# Patient Record
Sex: Male | Born: 1950 | Marital: Married | Smoking: Unknown if ever smoked
Health system: Southern US, Community
[De-identification: ages and names within clinical notes are randomized; demographics above are authoritative.]

## PROBLEM LIST (undated history)

## (undated) DIAGNOSIS — Z8616 Personal history of COVID-19: Secondary | ICD-10-CM

## (undated) DIAGNOSIS — R41841 Cognitive communication deficit: Secondary | ICD-10-CM

## (undated) DIAGNOSIS — Z972 Presence of dental prosthetic device (complete) (partial): Secondary | ICD-10-CM

## (undated) DIAGNOSIS — N400 Enlarged prostate without lower urinary tract symptoms: Secondary | ICD-10-CM

## (undated) DIAGNOSIS — M5134 Other intervertebral disc degeneration, thoracic region: Secondary | ICD-10-CM

## (undated) DIAGNOSIS — M069 Rheumatoid arthritis, unspecified: Secondary | ICD-10-CM

## (undated) DIAGNOSIS — K219 Gastro-esophageal reflux disease without esophagitis: Secondary | ICD-10-CM

## (undated) DIAGNOSIS — F32A Depression, unspecified: Secondary | ICD-10-CM

## (undated) DIAGNOSIS — G8929 Other chronic pain: Secondary | ICD-10-CM

## (undated) DIAGNOSIS — I10 Essential (primary) hypertension: Secondary | ICD-10-CM

---
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
## 2018-07-04 DIAGNOSIS — F32A Depression, unspecified: Secondary | ICD-10-CM | POA: Insufficient documentation

## 2018-07-04 DIAGNOSIS — G894 Chronic pain syndrome: Secondary | ICD-10-CM | POA: Insufficient documentation

## 2018-07-04 DIAGNOSIS — Z5900 Homelessness unspecified: Secondary | ICD-10-CM | POA: Insufficient documentation

## 2018-07-11 DIAGNOSIS — M0579 Rheumatoid arthritis with rheumatoid factor of multiple sites without organ or systems involvement: Secondary | ICD-10-CM | POA: Insufficient documentation

## 2018-07-11 DIAGNOSIS — K219 Gastro-esophageal reflux disease without esophagitis: Secondary | ICD-10-CM | POA: Insufficient documentation

## 2018-07-11 DIAGNOSIS — N400 Enlarged prostate without lower urinary tract symptoms: Secondary | ICD-10-CM | POA: Insufficient documentation

## 2018-07-11 DIAGNOSIS — I10 Essential (primary) hypertension: Secondary | ICD-10-CM | POA: Insufficient documentation

## 2018-07-12 DIAGNOSIS — R7301 Impaired fasting glucose: Secondary | ICD-10-CM | POA: Insufficient documentation

## 2018-07-30 DIAGNOSIS — Z91199 Patient's noncompliance with other medical treatment and regimen due to unspecified reason: Secondary | ICD-10-CM | POA: Insufficient documentation

## 2018-07-30 DIAGNOSIS — Z9119 Patient's noncompliance with other medical treatment and regimen: Secondary | ICD-10-CM | POA: Insufficient documentation

## 2019-01-18 DIAGNOSIS — R972 Elevated prostate specific antigen [PSA]: Secondary | ICD-10-CM | POA: Insufficient documentation

## 2019-01-18 DIAGNOSIS — Z227 Latent tuberculosis: Secondary | ICD-10-CM | POA: Insufficient documentation

## 2019-02-17 DIAGNOSIS — N1330 Unspecified hydronephrosis: Secondary | ICD-10-CM | POA: Insufficient documentation

## 2019-02-18 DIAGNOSIS — R32 Unspecified urinary incontinence: Secondary | ICD-10-CM | POA: Insufficient documentation

## 2019-06-06 DIAGNOSIS — C61 Malignant neoplasm of prostate: Secondary | ICD-10-CM | POA: Insufficient documentation

## 2019-06-09 DIAGNOSIS — G8929 Other chronic pain: Secondary | ICD-10-CM | POA: Insufficient documentation

## 2019-06-09 DIAGNOSIS — N3090 Cystitis, unspecified without hematuria: Secondary | ICD-10-CM | POA: Insufficient documentation

## 2019-06-23 DIAGNOSIS — Z466 Encounter for fitting and adjustment of urinary device: Secondary | ICD-10-CM | POA: Insufficient documentation

## 2020-01-15 DIAGNOSIS — E785 Hyperlipidemia, unspecified: Secondary | ICD-10-CM | POA: Insufficient documentation

## 2020-01-15 DIAGNOSIS — Z96659 Presence of unspecified artificial knee joint: Secondary | ICD-10-CM | POA: Insufficient documentation

## 2020-01-15 DIAGNOSIS — F339 Major depressive disorder, recurrent, unspecified: Secondary | ICD-10-CM | POA: Insufficient documentation

## 2020-01-16 DIAGNOSIS — G4700 Insomnia, unspecified: Secondary | ICD-10-CM | POA: Insufficient documentation

## 2020-06-22 ENCOUNTER — Other Ambulatory Visit (HOSPITAL_COMMUNITY): Payer: Self-pay | Admitting: Urology

## 2020-06-22 ENCOUNTER — Other Ambulatory Visit: Payer: Self-pay | Admitting: Urology

## 2020-06-22 DIAGNOSIS — C61 Malignant neoplasm of prostate: Secondary | ICD-10-CM

## 2020-06-29 ENCOUNTER — Other Ambulatory Visit (HOSPITAL_COMMUNITY): Payer: Self-pay | Admitting: Urology

## 2020-06-29 ENCOUNTER — Other Ambulatory Visit: Payer: Self-pay | Admitting: Urology

## 2020-06-29 DIAGNOSIS — C61 Malignant neoplasm of prostate: Secondary | ICD-10-CM

## 2020-07-12 ENCOUNTER — Other Ambulatory Visit: Payer: Self-pay

## 2020-07-12 ENCOUNTER — Encounter (HOSPITAL_BASED_OUTPATIENT_CLINIC_OR_DEPARTMENT_OTHER): Payer: Self-pay | Admitting: Urology

## 2020-07-12 NOTE — Progress Notes (Signed)
Faxed preop instructions to [facility] [phone], pt is resident of [facility] and signs own consents and transfers independent from wheel chair, will be rapid same day covid test arrive 1015 am 07-20-2020. Labs needed I stat 8 and ekg.

## 2020-07-12 NOTE — Progress Notes (Signed)
Preop instructions for:   Rylin Seavey    Date of Birth     [birth year]                       Date of Procedure:  07-20-2020     Doctor: Abner Greenspan Time to arrive at [facility]: 1015 am :Our address in [place], [place], drive up to main entrance and call [phone] and let front desk know patient is here for rapid covid test    Do not eat or drink past midnight the night before your procedure.(To include any tube feedings-must be discontinued)    Take these morning medications only with sips of water: Levaquin, Gabapentin, Metoprolol Succinate, leave butrans patch on  Fleets enam 800 am 07-20-2020   Facility contact:   [facility]                Phone:    [phone]     Fax : [phone]          Health Care POA: pt signs own consents per sheri  Transportation contact phone#: [facility] [phone]  Please send day of procedure:current med list and meds last taken that day, confirm nothing by mouth status from what time, Patient Demographic info( to include DNR status, problem list, allergies)    Bring Insurance card and picture ID Leave all jewelry and other valuables at place where living( no metal or rings to be worn) No contact lens Women-no make-up, no lotions,perfumes,powders Men-no colognes,lotions  Any questions day of procedure,call [facility] [phone]  Sent from :[facility]                  [place]                  Fax:[phone]  Sent by :   Zelphia Cairo            RN

## 2020-07-12 NOTE — Progress Notes (Signed)
Requested [facility] records last h and p, medication record from [facility], fax to use to return instructions is [phone] attention sherry

## 2020-07-16 NOTE — Progress Notes (Signed)
Spoke with sheila at [facility] faxed instructions faxed to [phone] recived friday 07-13-2020 and given to rn, rn will call [phone] in questions

## 2020-07-20 ENCOUNTER — Ambulatory Visit (HOSPITAL_COMMUNITY)
Admission: RE | Admit: 2020-07-20 | Discharge: 2020-07-20 | Disposition: A | Discharge status: Home / Self Care | Payer: Medicare HMO | Source: Ambulatory Visit | Attending: Urology | Admitting: Urology

## 2020-07-20 ENCOUNTER — Ambulatory Visit (HOSPITAL_BASED_OUTPATIENT_CLINIC_OR_DEPARTMENT_OTHER): Payer: Medicare HMO | Admitting: Anesthesiology

## 2020-07-20 ENCOUNTER — Other Ambulatory Visit: Payer: Self-pay

## 2020-07-20 ENCOUNTER — Ambulatory Visit (HOSPITAL_COMMUNITY): Payer: Self-pay

## 2020-07-20 ENCOUNTER — Ambulatory Visit (HOSPITAL_BASED_OUTPATIENT_CLINIC_OR_DEPARTMENT_OTHER)
Admission: RE | Admit: 2020-07-20 | Discharge: 2020-07-20 | Disposition: A | Discharge status: Skilled Nursing Facility | Payer: Medicare HMO | Attending: Urology | Admitting: Urology

## 2020-07-20 ENCOUNTER — Ambulatory Visit (HOSPITAL_BASED_OUTPATIENT_CLINIC_OR_DEPARTMENT_OTHER): Admit: 2020-07-20 | Payer: Self-pay | Admitting: Urology

## 2020-07-20 ENCOUNTER — Encounter (HOSPITAL_BASED_OUTPATIENT_CLINIC_OR_DEPARTMENT_OTHER): Payer: Self-pay

## 2020-07-20 ENCOUNTER — Encounter (HOSPITAL_BASED_OUTPATIENT_CLINIC_OR_DEPARTMENT_OTHER): Payer: Self-pay | Admitting: Urology

## 2020-07-20 ENCOUNTER — Encounter (HOSPITAL_BASED_OUTPATIENT_CLINIC_OR_DEPARTMENT_OTHER)
Admission: RE | Disposition: A | Discharge status: Skilled Nursing Facility | Payer: Self-pay | Source: Home / Self Care | Attending: Urology

## 2020-07-20 DIAGNOSIS — C61 Malignant neoplasm of prostate: Secondary | ICD-10-CM | POA: Diagnosis present

## 2020-07-20 DIAGNOSIS — R3914 Feeling of incomplete bladder emptying: Secondary | ICD-10-CM | POA: Insufficient documentation

## 2020-07-20 DIAGNOSIS — Z7982 Long term (current) use of aspirin: Secondary | ICD-10-CM | POA: Diagnosis not present

## 2020-07-20 DIAGNOSIS — Z79899 Other long term (current) drug therapy: Secondary | ICD-10-CM | POA: Diagnosis not present

## 2020-07-20 DIAGNOSIS — R339 Retention of urine, unspecified: Secondary | ICD-10-CM | POA: Insufficient documentation

## 2020-07-20 DIAGNOSIS — Z20822 Contact with and (suspected) exposure to covid-19: Secondary | ICD-10-CM | POA: Insufficient documentation

## 2020-07-20 HISTORY — PX: PROSTATE BIOPSY: SHX241

## 2020-07-20 HISTORY — DX: Rheumatoid arthritis, unspecified: M06.9

## 2020-07-20 HISTORY — DX: Depression, unspecified: F32.A

## 2020-07-20 HISTORY — DX: Gastro-esophageal reflux disease without esophagitis: K21.9

## 2020-07-20 HISTORY — DX: Benign prostatic hyperplasia without lower urinary tract symptoms: N40.0

## 2020-07-20 HISTORY — DX: Cognitive communication deficit: R41.841

## 2020-07-20 HISTORY — DX: Essential (primary) hypertension: I10

## 2020-07-20 HISTORY — DX: Other intervertebral disc degeneration, thoracic region: M51.34

## 2020-07-20 HISTORY — DX: Other chronic pain: G89.29

## 2020-07-20 LAB — RESP PANEL BY RT-PCR (FLU A&B, COVID) ARPGX2
Influenza A by PCR: NEGATIVE
Influenza B by PCR: NEGATIVE
SARS Coronavirus 2 by RT PCR: NEGATIVE

## 2020-07-20 LAB — POCT I-STAT, CHEM 8
BUN: 21 mg/dL (ref 8–23)
Calcium, Ion: 1.07 mmol/L — ABNORMAL LOW (ref 1.15–1.40)
Chloride: 106 mmol/L (ref 98–111)
Creatinine, Ser: 2.3 mg/dL — ABNORMAL HIGH (ref 0.61–1.24)
Glucose, Bld: 86 mg/dL (ref 70–99)
HCT: 41 % (ref 39.0–52.0)
Hemoglobin: 13.9 g/dL (ref 13.0–17.0)
Potassium: 4.4 mmol/L (ref 3.5–5.1)
Sodium: 143 mmol/L (ref 135–145)
TCO2: 26 mmol/L (ref 22–32)

## 2020-07-20 SURGERY — BIOPSY, PROSTATE, RECTAL APPROACH, WITH US GUIDANCE
Anesthesia: General | Site: Rectum

## 2020-07-20 SURGERY — BIOPSY, PROSTATE, RECTAL APPROACH, WITH US GUIDANCE
Anesthesia: General

## 2020-07-20 MED ORDER — ONDANSETRON HCL 4 MG/2ML IJ SOLN
INTRAMUSCULAR | Status: AC
  Filled 2020-07-20: qty 2

## 2020-07-20 MED ORDER — PHENYLEPHRINE 40 MCG/ML (10ML) SYRINGE FOR IV PUSH (FOR BLOOD PRESSURE SUPPORT)
PREFILLED_SYRINGE | INTRAVENOUS | Status: DC | PRN
  Administered 2020-07-20: 120 ug via INTRAVENOUS
  Administered 2020-07-20: 80 ug via INTRAVENOUS
  Administered 2020-07-20: 120 ug via INTRAVENOUS

## 2020-07-20 MED ORDER — LIDOCAINE 2% (20 MG/ML) 5 ML SYRINGE
INTRAMUSCULAR | Status: DC | PRN
  Administered 2020-07-20: 40 mg via INTRAVENOUS

## 2020-07-20 MED ORDER — DEXMEDETOMIDINE (PRECEDEX) IN NS 20 MCG/5ML (4 MCG/ML) IV SYRINGE
PREFILLED_SYRINGE | INTRAVENOUS | Status: DC | PRN
  Administered 2020-07-20 (×2): 4 ug via INTRAVENOUS

## 2020-07-20 MED ORDER — SODIUM CHLORIDE 0.9 % IV SOLN: INTRAVENOUS | Status: DC

## 2020-07-20 MED ORDER — ONDANSETRON HCL 4 MG/2ML IJ SOLN
INTRAMUSCULAR | Status: DC | PRN
  Administered 2020-07-20: 4 mg via INTRAVENOUS

## 2020-07-20 MED ORDER — KETAMINE HCL 10 MG/ML IJ SOLN
INTRAMUSCULAR | Status: DC | PRN
  Administered 2020-07-20: 10 mg via INTRAVENOUS

## 2020-07-20 MED ORDER — DEXAMETHASONE SODIUM PHOSPHATE 10 MG/ML IJ SOLN
INTRAMUSCULAR | Status: AC
  Filled 2020-07-20: qty 1

## 2020-07-20 MED ORDER — FENTANYL CITRATE (PF) 100 MCG/2ML IJ SOLN
INTRAMUSCULAR | Status: AC
  Filled 2020-07-20: qty 2

## 2020-07-20 MED ORDER — PHENYLEPHRINE 40 MCG/ML (10ML) SYRINGE FOR IV PUSH (FOR BLOOD PRESSURE SUPPORT)
PREFILLED_SYRINGE | INTRAVENOUS | Status: AC
  Filled 2020-07-20: qty 10

## 2020-07-20 MED ORDER — PROPOFOL 500 MG/50ML IV EMUL
INTRAVENOUS | Status: AC
  Filled 2020-07-20: qty 50

## 2020-07-20 MED ORDER — CEFTRIAXONE SODIUM 2 G IJ SOLR
INTRAMUSCULAR | Status: AC
  Filled 2020-07-20: qty 20

## 2020-07-20 MED ORDER — SODIUM CHLORIDE 0.9 % IV SOLN
INTRAVENOUS | Status: AC
  Filled 2020-07-20: qty 100

## 2020-07-20 MED ORDER — PROPOFOL 10 MG/ML IV BOLUS
INTRAVENOUS | Status: DC | PRN
  Administered 2020-07-20: 40 mg via INTRAVENOUS

## 2020-07-20 MED ORDER — FENTANYL CITRATE (PF) 100 MCG/2ML IJ SOLN
INTRAMUSCULAR | Status: DC | PRN
  Administered 2020-07-20: 50 ug via INTRAVENOUS

## 2020-07-20 MED ORDER — PROPOFOL 500 MG/50ML IV EMUL
INTRAVENOUS | Status: DC | PRN
  Administered 2020-07-20: 200 ug/kg/min via INTRAVENOUS

## 2020-07-20 MED ORDER — LIDOCAINE 2% (20 MG/ML) 5 ML SYRINGE
INTRAMUSCULAR | Status: AC
  Filled 2020-07-20: qty 5

## 2020-07-20 MED ORDER — KETAMINE HCL 10 MG/ML IJ SOLN
INTRAMUSCULAR | Status: AC
  Filled 2020-07-20: qty 1

## 2020-07-20 MED ORDER — PROPOFOL 10 MG/ML IV BOLUS
INTRAVENOUS | Status: AC
  Filled 2020-07-20: qty 40

## 2020-07-20 MED ORDER — GENTAMICIN SULFATE 40 MG/ML IJ SOLN
5.0000 mg/kg | INTRAVENOUS | Status: AC
  Administered 2020-07-20: 440 mg via INTRAVENOUS
  Filled 2020-07-20: qty 11

## 2020-07-20 SURGICAL SUPPLY — 8 items
CNTNR URN SCR LID CUP LEK RST (MISCELLANEOUS) ×1 IMPLANT
CONT SPEC 4OZ STRL OR WHT (MISCELLANEOUS) ×3
GLOVE BIOGEL PI IND STRL 7.5 (GLOVE) ×1 IMPLANT
GLOVE BIOGEL PI INDICATOR 7.5 (GLOVE) ×2
INSTR BIOPSY MAXCORE 18GX20 (NEEDLE) ×3 IMPLANT
KIT TURNOVER CYSTO (KITS) ×3 IMPLANT
NEEDLE SPNL 22GX7 QUINCKE BK (NEEDLE) ×3 IMPLANT
UNDERPAD 30X36 HEAVY ABSORB (UNDERPADS AND DIAPERS) ×3 IMPLANT

## 2020-07-20 NOTE — Anesthesia Postprocedure Evaluation (Signed)
Anesthesia Post Note  Patient: Jeffrey Key  Procedure(s) Performed: BIOPSY TRANSRECTAL ULTRASONIC PROSTATE (TUBP) (N/A Rectum)     Patient location during evaluation: PACU Anesthesia Type: MAC Level of consciousness: awake and alert Pain management: pain level controlled Vital Signs Assessment: post-procedure vital signs reviewed and stable Respiratory status: spontaneous breathing, nonlabored ventilation, respiratory function stable and patient connected to nasal cannula oxygen Cardiovascular status: stable and blood pressure returned to baseline Postop Assessment: no apparent nausea or vomiting Anesthetic complications: no   No complications documented.  Last Vitals:  Vitals:   07/20/20 1539 07/20/20 1545  BP: 95/66 98/63  Pulse: 71 71  Resp:  15  Temp:  36.6 C  SpO2: 93% 98%    Last Pain:  Vitals:   07/20/20 1545  TempSrc:   PainSc: 0-No pain                 Barnet Glasgow

## 2020-07-20 NOTE — Anesthesia Preprocedure Evaluation (Addendum)
Anesthesia Evaluation  Patient identified by MRN, date of birth, ID band Patient awake    Reviewed: Allergy & Precautions, NPO status , Patient's Chart, lab work & pertinent test results  Airway Mallampati: I  TM Distance: >3 FB Neck ROM: Full    Dental no notable dental hx. (+) Edentulous Upper, Edentulous Lower   Pulmonary neg pulmonary ROS,    Pulmonary exam normal breath sounds clear to auscultation       Cardiovascular hypertension, Normal cardiovascular exam Rhythm:Regular Rate:Normal     Neuro/Psych negative neurological ROS  negative psych ROS   GI/Hepatic Neg liver ROS, GERD  ,  Endo/Other  negative endocrine ROS  Renal/GU negative Renal ROSK+ 4.4      Musculoskeletal  (+) Arthritis ,   Abdominal   Peds  Hematology negative hematology ROS (+) hgb 13.9   Anesthesia Other Findings   Reproductive/Obstetrics                             Anesthesia Physical Anesthesia Plan  ASA: III  Anesthesia Plan: MAC   Post-op Pain Management:    Induction: Intravenous  PONV Risk Score and Plan: Treatment may vary due to age or medical condition  Airway Management Planned: Nasal Cannula and Natural Airway  Additional Equipment: None  Intra-op Plan:   Post-operative Plan:   Informed Consent: I have reviewed the patients History and Physical, chart, labs and discussed the procedure including the risks, benefits and alternatives for the proposed anesthesia with the patient or authorized representative who has indicated his/her understanding and acceptance.     Dental advisory given  Plan Discussed with: CRNA and Anesthesiologist  Anesthesia Plan Comments:        Anesthesia Quick Evaluation

## 2020-07-20 NOTE — H&P (Signed)
Jeffrey Key  MRN: [id number]  DOB: 14-Nov-1950, 69 year old Male  SSN:    PRIMARY CARE:    REFERRING:  Seward Carol  PROVIDER:  Rexene Alberts, M.D.  LOCATION:  [facility] [phone]     --------------------------------------------------------------------------------   CC/HPI: Jeffrey Key is a 68 year old male seen in follow-up with prostate cancer and incomplete bladder emptying.    He did obtain urology care at [facility] previously with Dr. Ree Edman and was last seen by them 07/2019.  It appears he was diagnosed with prostate cancer on 05/06/2019 with pathology revealing Gleason 3+4 = 7 (5%) and 1/12 cores and Gleason 3+3 equal 6 and 4/12 cores (40% - 90%). Prebiopsy PSA was 9.0 on 02/17/2019 with a percent free PSA of 10. I do not see a TRUS volume in the available chart. He did visit with radiation oncology in [facility] who recommended active surveillance. He also advised a TURP prior to radiation and stated that he would be a candidate for radiation therapy in the future if he had a prior TURP.  He does have a history of urinary retention requiring catheter placement. The patient was very frustrated with this and demanded for the catheter to be removed. He has not had a catheter in for quite some time. His PVR today is 450 mL. He was scheduled to undergo UDS however missed this appointment at [facility] urologist. The plan was to perform a TURP if it was found that he had bladder function.  The patient is declining Foley catheter placement today and actually threw a pillow across the room when I told him that urodynamics entailed put a small catheter into his bladder to measure his pressures.  As he is on active surveillance, I recommended repeat confirmatory biopsy. Patient demands at this procedure be performed under some anesthesia.   He is also declining PSA draw today.   Patient currently denies fever, chills, sweats, nausea, vomiting,  abdominal or flank pain, gross hematuria or dysuria.      ALLERGIES: tramadol Trazadone    MEDICATIONS: Aspirin 81 mg tablet,chewable  Finasteride 5 mg tablet  Omeprazole 20 mg capsule,delayed release  Tamsulosin Hcl 0.4 mg capsule  Diclofenac Sodium 1 % gel  Gabapentin 400 mg capsule  Hydroxychloroquine Sulfate 200 mg tablet  Multivitamin  Nortriptyline Hcl 75 mg capsule  Triamcinolone Acetonide 0.5 % cream  Vitamin D3 25 mcg (1,000 unit) tablet,chewable     GU PSH: None   NON-GU PSH: Back Surgery (Unspecified) Cholecystectomy (laparoscopic)     GU PMH: Incomplete bladder emptying - 05/17/2020 Prostate Cancer - 05/17/2020    NON-GU PMH: GERD Hypertension    FAMILY HISTORY: None    Notes: No FH of GU malignancy   SOCIAL HISTORY: Marital Status: Single Preferred Language: English; Ethnicity: Not Hispanic Or Latino; Race: White Current Smoking Status: Patient has never smoked.   Tobacco Use Assessment Completed: Used Tobacco in last 30 days? Has never drank.  Does not drink caffeine.    REVIEW OF SYSTEMS:    GU Review Male:   Patient denies frequent urination, hard to postpone urination, burning/ pain with urination, get up at night to urinate, leakage of urine, stream starts and stops, trouble starting your stream, have to strain to urinate , erection problems, and penile pain.  Gastrointestinal (Upper):   Patient denies nausea, vomiting, and indigestion/ heartburn.  Gastrointestinal (Lower):   Patient denies diarrhea and constipation.  Constitutional:   Patient denies fever, night sweats, weight loss,  and fatigue.  Skin:   Patient denies skin rash/ lesion and itching.  Eyes:   Patient denies blurred vision and double vision.  Ears/ Nose/ Throat:   Patient denies sore throat and sinus problems.  Hematologic/Lymphatic:   Patient denies swollen glands and easy bruising.  Cardiovascular:   Patient denies leg swelling and chest pains.  Respiratory:   Patient denies  cough and shortness of breath.  Endocrine:   Patient denies excessive thirst.  Musculoskeletal:   Patient denies joint pain and back pain.  Neurological:   Patient denies headaches and dizziness.  Psychologic:   Patient denies depression and anxiety.   VITAL SIGNS:      06/14/2020 02:08 PM  Weight 255 lb / 115.67 kg  Height 72 in / 182.88 cm  BP 112/78 mmHg  Pulse 94 /min  Temperature 98.0 F / 36.6 C  BMI 34.6 kg/m   MULTI-SYSTEM PHYSICAL EXAMINATION:    Constitutional: Well-nourished. No physical deformities. Normally developed. Good grooming.  Respiratory: No labored breathing, no use of accessory muscles.   Cardiovascular: Normal temperature, normal extremity pulses, no swelling, no varicosities.  Gastrointestinal: No mass, no tenderness, no rigidity, non obese abdomen.     Complexity of Data:  Source Of History:  Patient  Lab Test Review:   PSA  Records Review:   Pathology Reports, Previous Doctor Records, Previous Patient Records  Urine Test Review:   Urinalysis   PROCEDURES:         PVR Ultrasound - 97989  Scanned Volume: 415 cc         Urinalysis w/Scope Dipstick Dipstick Cont'd Micro  Color: Yellow Bilirubin: Neg mg/dL WBC/hpf: >60/hpf  Appearance: Cloudy Ketones: Neg mg/dL RBC/hpf: 3 - 10/hpf  Specific Gravity: 1.015 Blood: 2+ ery/uL Bacteria: Many (>50/hpf)  pH: 7.0 Protein: 2+ mg/dL Cystals: NS (Not Seen)  Glucose: Neg mg/dL Urobilinogen: 0.2 mg/dL Casts: NS (Not Seen)    Nitrites: Neg Trichomonas: Not Present    Leukocyte Esterase: 3+ leu/uL Mucous: Present      Epithelial Cells: NS (Not Seen)      Yeast: NS (Not Seen)      Sperm: Not Present    ASSESSMENT:      ICD-10 Details  1 GU:   Prostate Cancer - C61   2   Incomplete bladder emptying - R39.14    PLAN:           Orders Labs CULTURE, URINE, PSA          Schedule Return Visit/Planned Activity: Next Available Appointment - Urodynamics          Document Letter(s):  Created for Patient:  Clinical Summary         Notes:   1. cTxNxMx GS 3+4 = 7 (Dx 05/06/2019 ), adenocarcinoma the prostate with 5/12 cores positive (1 core 3+4 (5%) and 4 cores 3+3 (40-90%), prostate volume unkown, prebiopsy PSA = 9.0ng/mL  -Reviewed pathology report in detail with patient today. Explained that the patient has intermediate risk disease based on his pathology report.  -Discussed treatment options for localized prostate cancer to include RALP vs XRT +/- ADT  -Patient has been placed on active surveillance by [facility] urology. However there is some discussion of pursuing radiation therapy after TURP. We will proceed with scheduling confirmatory biopsy under anesthesia per preference of the patient as well as arranging for urodynamic studies to ensure that he has adequate detrusor function prior to TURP. As he has a high risk of incontinence after TURP with  an adequate detrusor function.  -We will plan to cover with 1 g ceftriaxone and 5 mg/kg gentamicin the a.m. of biopsy in the surgery center.   Discussion:  Using NCCN risk stratification criteria, his disease is considered intermediate risk. We discussed his diagnosis in detail, reviewing the significance of gleason score, PSA, DRE, and percentage of cores positive. We discussed the various management options for prostate cancer, including active surveillance, open and robotic radical prostatectomy, EBRT, brachytherapy, proton therapy, and cryotherapy. We discussed the generally indolent course of many prostate cancers, and the generally favorable oncologic control offered by all treatment strategies. However, I emphasized that all treatments offer only potential for cure and that in many cases multimodal therapy may ultimately be utilized for long term cancer control. We also discussed the impact of treatment on sexual and urinary function. I emphasized that each treatment has unique quality of life impact and recovery profiles, and we reviewed the possible  effects of surgery, radiation, and cryotherapy on quality of life outcomes. All questions were answered.   2. History of urinary retention/incomplete bladder emptying: PVR today is 415 mL. He is refusing Foley catheter placement this time. Will arrange for urodynamics as above to ensure adequate detrusor function prior to possible bladder outlet of procedure.   Of note, patient has been difficult with the staff here and has been difficult with prior practices. He was late to his appointment today and has missed prior appointments with [facility] urology. He is a current resident of [facility]. He refuses to be seen by [facility] urology as he is "fed up" with their care.   CC: Cyndi Bender           Next Appointment:      Next Appointment: 06/14/2020 01:30 PM    Appointment Type: Office Visit Established Patient    Location: [facility] [phone]    Provider: Rexene Alberts, M.D.    Reason for Visit: 1 mo ov       Signed by Rexene Alberts, M.D. on 06/14/20 at 4:28 PM (EDT)  Urology Preoperative H&P   Chief Complaint: prostate cancer  History of Present Illness: Jeffrey Key is a 69 y.o. male with prostate cancer here for TRUS biopsy.    Past Medical History:  Diagnosis Date  . BPH (benign prostatic hyperplasia)   . Chronic pain   . Cognitive communication deficit   . 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 Depression   . GERD (gastroesophageal reflux disease)   . Hypertension   . Rheumatoid arthritis ([place])     History reviewed. No pertinent surgical history.  Allergies: No Known Allergies  History reviewed. No pertinent family history.  Social History:  has no history on file for tobacco use, alcohol use, and drug use.  ROS: A complete review of systems was performed.  All systems are negative except for pertinent findings as noted.  Physical Exam:  Vital signs in last 24 hours: Temp:  [97.8 F (36.6 C)] 97.8 F (36.6 C) (11/19  1130) Pulse Rate:  [79] 79 (11/19 1130) Resp:  [16] 16 (11/19 1130) BP: (140)/(90) 140/90 (11/19 1130) SpO2:  [98 %] 98 % (11/19 1130) Weight:  [103.1 kg] 103.1 kg (11/19 1130) Constitutional:  Alert and oriented, No acute distress Cardiovascular: Regular rate and rhythm Respiratory: Normal respiratory effort, Lungs clear bilaterally GI: Abdomen is soft, nontender, nondistended, no abdominal masses GU: No CVA tenderness Lymphatic: No lymphadenopathy Neurologic: Grossly intact, no focal deficits Psychiatric: Normal  mood and affect  Laboratory Data:  Recent Labs    07/20/20 1151  HGB 13.9  HCT 41.0    Recent Labs    07/20/20 1151  NA 143  K 4.4  CL 106  GLUCOSE 86  BUN 21  CREATININE 2.30*     Results for orders placed or performed during the hospital encounter of 07/20/20 (from the past 24 hour(s))  Resp Panel by RT-PCR (Flu A&B, Covid) Nasopharyngeal Swab     Status: None   Collection Time: 07/20/20  9:24 AM   Specimen: Nasopharyngeal Swab; Nasopharyngeal(NP) swabs in vial transport medium  Result Value Ref Range   SARS Coronavirus 2 by RT PCR NEGATIVE NEGATIVE   Influenza A by PCR NEGATIVE NEGATIVE   Influenza B by PCR NEGATIVE NEGATIVE  I-STAT, chem 8     Status: Abnormal   Collection Time: 07/20/20 11:51 AM  Result Value Ref Range   Sodium 143 135 - 145 mmol/L   Potassium 4.4 3.5 - 5.1 mmol/L   Chloride 106 98 - 111 mmol/L   BUN 21 8 - 23 mg/dL   Creatinine, Ser 2.30 (H) 0.61 - 1.24 mg/dL   Glucose, Bld 86 70 - 99 mg/dL   Calcium, Ion 1.07 (L) 1.15 - 1.40 mmol/L   TCO2 26 22 - 32 mmol/L   Hemoglobin 13.9 13.0 - 17.0 g/dL   HCT 41.0 39 - 52 %   Recent Results (from the past 240 hour(s))  Resp Panel by RT-PCR (Flu A&B, Covid) Nasopharyngeal Swab     Status: None   Collection Time: 07/20/20  9:24 AM   Specimen: Nasopharyngeal Swab; Nasopharyngeal(NP) swabs in vial transport medium  Result Value Ref Range Status   SARS Coronavirus 2 by RT PCR NEGATIVE  NEGATIVE Final    Comment: (NOTE) SARS-CoV-2 target nucleic acids are NOT DETECTED.  The SARS-CoV-2 RNA is generally detectable in upper respiratory specimens during the acute phase of infection. The lowest concentration of SARS-CoV-2 viral copies this assay can detect is 138 copies/mL. A negative result does not preclude SARS-Cov-2 infection and should not be used as the sole basis for treatment or other patient management decisions. A negative result may occur with  improper specimen collection/handling, submission of specimen other than nasopharyngeal swab, presence of viral mutation(s) within the areas targeted by this assay, and inadequate number of viral copies(<138 copies/mL). A negative result must be combined with clinical observations, patient history, and epidemiological information. The expected result is Negative.  Fact Sheet for Patients:  EntrepreneurPulse.com.au  Fact Sheet for Healthcare Providers:  IncredibleEmployment.be  This test is no t yet approved or cleared by the [place] FDA and  has been authorized for detection and/or diagnosis of SARS-CoV-2 by FDA under an Emergency Use Authorization (EUA). This EUA will remain  in effect (meaning this test can be used) for the duration of the COVID-19 declaration under Section 564(b)(1) of the Act, 21 U.S.C.section 360bbb-3(b)(1), unless the authorization is terminated  or revoked sooner.       Influenza A by PCR NEGATIVE NEGATIVE Final   Influenza B by PCR NEGATIVE NEGATIVE Final    Comment: (NOTE) The Xpert Xpress SARS-CoV-2/FLU/RSV plus assay is intended as an aid in the diagnosis of influenza from Nasopharyngeal swab specimens and should not be used as a sole basis for treatment. Nasal washings and aspirates are unacceptable for Xpert Xpress SARS-CoV-2/FLU/RSV testing.  Fact Sheet for Patients: EntrepreneurPulse.com.au  Fact Sheet for Healthcare  Providers: IncredibleEmployment.be  This test is not  yet approved or cleared by the [place] and has been authorized for detection and/or diagnosis of SARS-CoV-2 by FDA under an Emergency Use Authorization (EUA). This EUA will remain in effect (meaning this test can be used) for the duration of the COVID-19 declaration under Section 564(b)(1) of the Act, 21 U.S.C. section 360bbb-3(b)(1), unless the authorization is terminated or revoked.  Performed at [facility], [place] [address], [place], [place] [address]     Renal Function: Recent Labs    07/20/20 1151  CREATININE 2.30*   Estimated Creatinine Clearance: 38.7 mL/min (A) (by C-G formula based on SCr of 2.3 mg/dL (H)).  Radiologic Imaging: No results found.  I independently reviewed the above imaging studies.  Assessment and Plan Kristapher Key is a 69 y.o. male with prostate cancer here for TRUS biopsy. Receiving gent. Risks and benefits discussed. Ready to proceed.     Matt R. Marissia Blackham MD 07/20/2020, 2:02 PM  [facility] Pager: [phone]): [phone]

## 2020-07-20 NOTE — Transfer of Care (Signed)
Immediate Anesthesia Transfer of Care Note  Patient: Jeffrey Key  Procedure(s) Performed: BIOPSY TRANSRECTAL ULTRASONIC PROSTATE (TUBP) (N/A Rectum)  Patient Location: PACU  Anesthesia Type:MAC  Level of Consciousness: awake, alert , oriented and patient cooperative  Airway & Oxygen Therapy: Patient Spontanous Breathing and Patient connected to face mask oxygen  Post-op Assessment: Report given to RN and Post -op Vital signs reviewed and stable  Post vital signs: Reviewed and stable  Last Vitals:  Vitals Value Taken Time  BP 95/66 07/20/20 1538  Temp    Pulse 71 07/20/20 1542  Resp 16 07/20/20 1542  SpO2 98 % 07/20/20 1542  Vitals shown include unvalidated device data.  Last Pain:  Vitals:   07/20/20 1130  TempSrc: Oral  PainSc: 3       Patients Stated Pain Goal: 7 (76/16/07 3710)  Complications: No complications documented.

## 2020-07-20 NOTE — Op Note (Signed)
Operative Note  Preoperative diagnosis:  1.  Prostate cancer  Postoperative diagnosis: 1.  Prostate cancer  Procedure(s): 1.  Transrectal ultrasound-guided prostate biopsy  Surgeon: Rexene Alberts, MD  Assistants:  None  Anesthesia:  General  Complications:  None  EBL:  minimal  Specimens: 12 separate specimens: Right lateral base, right medial base, right lateral mid, right medial mid, right apex lateral, right apex medial; left lateral base, left medial base, left lateral mid, left medial mid, left apex lateral, left apex medial  ID Type Source Tests Collected by Time Destination  1 : righ base lateral  Tissue PATH Prostate biopsy SURGICAL PATHOLOGY Janith Lima, MD 07/20/2020 1518   2 : righ base medial Tissue PATH Prostate biopsy SURGICAL PATHOLOGY Janith Lima, MD 07/20/2020 1535     Drains/Catheters: 1.  none  Intraoperative findings:   1. none  Indication:  Jeffrey Key is a 69 y.o. male with localized prostate cancer diagnosed in 05/06/2019 Gleason score 3+4 = 7 in 5/12 cores positive.  He previously obtained this in [place].  He presents today for prostate biopsy.  He declined biopsy in the office.  After a thorough discussion, all relevant risk benefits alternatives, he presents the operating today for transrectal sound guided biopsy of the prostate.  Description of procedure: After adequate MAC, he was positioned in the left lateral decubitous position.  Transit rectal ultrasound was performed and we measured his prostate to be approximately 20 g.  Next we proceeded to obtain 12 separate specimens.  We proceeded with her standard template including the right lateral base, right medial base, right lateral mid, right medial mid, right apex lateral, right apex medial; left lateral base, left medial base, left lateral mid, left medial mid, left apex lateral, left apex medial.  These were then sent to pathology.  Concluded the procedure he had no significant gross  blood coming from his rectum.  He will follow-up in the office in 1 week to review pathology.  Matt R. [facility]  Pager: [phone]

## 2020-07-20 NOTE — Discharge Instructions (Signed)
Activity:  You are encouraged to ambulate frequently (about every hour during waking hours) to help prevent blood clots from forming in your legs or lungs.  However, you should not engage in any heavy lifting (> 10-15 lbs), strenuous activity, or straining.   Diet: You should advance your diet as instructed by your physician.  It will be normal to have some bloating, nausea, and abdominal discomfort intermittently.   Incisions: You may remove your dressing bandages 48 hours after surgery if not removed in the hospital.  You will either have some small staples or special tissue glue at each of the incision sites. Once the bandages are removed (if present), the incisions may stay open to air.  You may start showering (but not soaking or bathing in water) the 2nd day after surgery and the incisions simply need to be patted dry after the shower.  No additional care is needed.   What to call us about: You should call the office [phone]) if you develop fever > 101 or develop persistent vomiting. Activity:  You are encouraged to ambulate frequently (about every hour during waking hours) to help prevent blood clots from forming in your legs or lungs.  However, you should not engage in any heavy lifting (> 10-15 lbs), strenuous activity, or straining.   Transrectal Ultrasound-Guided Prostate Biopsy, Care After This sheet gives you information about how to care for yourself after your procedure. Your doctor may also give you more specific instructions. If you have problems or questions, contact your doctor. What can I expect after the procedure? After the procedure, it is common to have:  Pain and discomfort in your butt, especially while sitting.  Pink-colored pee (urine), due to small amounts of blood in the pee.  Burning while peeing (urinating).  Blood in your poop (stool).  Bleeding from your butt.  Blood in your semen. Follow these instructions at home: Medicines  Take  over-the-counter and prescription medicines only as told by your doctor.  If you were prescribed antibiotic medicine, take it as told by your doctor. Do not stop taking the antibiotic even if you start to feel better. Activity   Do not drive for 24 hours if you were given a medicine to help you relax (sedative) during your procedure.  Return to your normal activities as told by your doctor. Ask your doctor what activities are safe for you.  Ask your doctor when it is okay for you to have sex.  Do not lift anything that is heavier than 10 lb (4.5 kg), or the limit that you are told, until your doctor says that it is safe. General instructions   Drink enough water to keep your pee pale yellow.  Watch your pee, poop, and semen for new bleeding or bleeding that gets worse.  Keep all follow-up visits as told by your doctor. This is important. Contact a doctor if you:  Have blood clots in your pee or poop.  Notice that your pee smells bad or unusual.  Have very bad belly pain.  Have trouble peeing.  Notice that your lower belly feels firm.  Have blood in your pee for more than 2 weeks after the procedure.  Have blood in your semen for more than 2 months after the procedure.  Have problems getting an erection.  Feel sick to your stomach (nauseous).  Throw up (vomit).  Have new or worse bleeding in your pee, poop, or semen. Get help right away if you:  Have a  fever or chills.  Have bright red pee.  Have very bad pain that does not get better with medicine.  Cannot pee. Summary  After this procedure, it is common to have pain and discomfort around your butt, especially while sitting.  You may have blood in your pee and poop.  It is common to have blood in your semen for 1-2 months.  If you were prescribed antibiotic medicine, take it as told by your doctor. Do not stop taking the antibiotic even if you start to feel better.  Get help right away if you have a  fever or chills. This information is not intended to replace advice given to you by your health care provider. Make sure you discuss any questions you have with your health care provider. Document Revised: 12/08/2018 Document Reviewed: 06/16/2017 Elsevier Patient Education  [place] Instructions  Activity: Get plenty of rest for the remainder of the day. A responsible individual must stay with you for 24 hours following the procedure.  For the next 24 hours, DO NOT: -Drive a car -Paediatric nurse -Drink alcoholic beverages -Take any medication unless instructed by your physician -Make any legal decisions or sign important papers.  Meals: Start with liquid foods such as gelatin or soup. Progress to regular foods as tolerated. Avoid greasy, spicy, heavy foods. If nausea and/or vomiting occur, drink only clear liquids until the nausea and/or vomiting subsides. Call your physician if vomiting continues.  Special Instructions/Symptoms: Your throat may feel dry or sore from the anesthesia or the breathing tube placed in your throat during surgery. If this causes discomfort, gargle with warm salt water. The discomfort should disappear within 24 hours.

## 2020-07-23 ENCOUNTER — Encounter (HOSPITAL_BASED_OUTPATIENT_CLINIC_OR_DEPARTMENT_OTHER): Payer: Self-pay | Admitting: Urology

## 2020-07-23 LAB — SURGICAL PATHOLOGY

## 2020-08-28 ENCOUNTER — Other Ambulatory Visit: Payer: Self-pay | Admitting: Urology

## 2020-10-02 DIAGNOSIS — R45851 Suicidal ideations: Secondary | ICD-10-CM | POA: Diagnosis not present

## 2020-10-02 DIAGNOSIS — I10 Essential (primary) hypertension: Secondary | ICD-10-CM | POA: Diagnosis not present

## 2020-10-02 DIAGNOSIS — F419 Anxiety disorder, unspecified: Secondary | ICD-10-CM | POA: Diagnosis not present

## 2020-10-02 DIAGNOSIS — R45850 Homicidal ideations: Secondary | ICD-10-CM | POA: Diagnosis not present

## 2020-10-02 DIAGNOSIS — F339 Major depressive disorder, recurrent, unspecified: Secondary | ICD-10-CM | POA: Diagnosis not present

## 2020-10-02 DIAGNOSIS — F329 Major depressive disorder, single episode, unspecified: Secondary | ICD-10-CM | POA: Diagnosis not present

## 2020-10-02 DIAGNOSIS — N400 Enlarged prostate without lower urinary tract symptoms: Secondary | ICD-10-CM | POA: Diagnosis not present

## 2020-10-02 DIAGNOSIS — G8929 Other chronic pain: Secondary | ICD-10-CM | POA: Diagnosis not present

## 2020-10-03 DIAGNOSIS — I10 Essential (primary) hypertension: Secondary | ICD-10-CM | POA: Diagnosis not present

## 2020-10-03 DIAGNOSIS — M6281 Muscle weakness (generalized): Secondary | ICD-10-CM | POA: Diagnosis not present

## 2020-10-03 DIAGNOSIS — M549 Dorsalgia, unspecified: Secondary | ICD-10-CM | POA: Diagnosis not present

## 2020-10-03 DIAGNOSIS — M0520 Rheumatoid vasculitis with rheumatoid arthritis of unspecified site: Secondary | ICD-10-CM | POA: Diagnosis not present

## 2020-10-03 DIAGNOSIS — R569 Unspecified convulsions: Secondary | ICD-10-CM | POA: Diagnosis not present

## 2020-10-03 DIAGNOSIS — G894 Chronic pain syndrome: Secondary | ICD-10-CM | POA: Diagnosis not present

## 2020-10-05 ENCOUNTER — Encounter (HOSPITAL_COMMUNITY): Payer: Self-pay | Admitting: Urology

## 2020-10-05 NOTE — Patient Instructions (Addendum)
Preop instructions for:  Jeffrey Key   Date of Birth: Nov 12, 1950                      Date of Procedure:   10/12/20 Procedure:   [place] TUBE PLACEMENT   Surgeon: Dr. Rexene Alberts Facility contact: Ritta Slot    Phone:   [phone]              Health Care POA: RN contact name/phone#:    Malachi Pro                      and Fax #: [phone]   Transportation contact phone#: Joycelyn Man (DOT) at [facility] [phone]    Time to arrive at [facility]: 10:30 AM   Report to: Admitting (On your left hand side)    Do not eat past midnight the night before your procedure.(To include any tube feedings-must be discontinued)  May have liquids until 10:30 AM day of surgery  CLEAR LIQUID DIET  Foods Allowed                                                                     Foods Excluded  Water, Black Coffee and tea, regular and decaf                             liquids that you cannot  Plain Jell-O in any flavor  (No red)                                           see through such as: Fruit ices (not with fruit pulp)                                     milk, soups, orange juice  Iced Popsicles (No red)                                    All solid food                                   Apple juices Sports drinks like Gatorade (No red) Lightly seasoned clear broth or consume(fat free) Sugar, honey syrup  Sample Menu Breakfast                                Lunch                                     Supper Cranberry juice                    Beef broth  Chicken broth Jell-O                                     Grape juice                           Apple juice Coffee or tea                        Jell-O                                      Popsicle                                                Coffee or tea                        Coffee or tea   Take these morning medications only with sips of water.(or give through  gastrostomy or feeding tube). Omeprazole, Gabapentin, Finasteride, Metoprolol,   May use eyedrops the morning of procedure    Note: No Insulin or Diabetic meds should be given or taken the morning of the procedure!   Please send day of procedure:current med list and meds last taken that day, confirm nothing by mouth status from what time, Patient Demographic info( to include DNR status, problem list, allergies)   Bring Insurance card and picture ID Leave all jewelry and other valuables at place where living( no metal or rings to be worn) No contact lens Women-no make-up, no lotions,perfumes,powders Men-no colognes,lotions   Any questions day of procedure,call  SHORT [phone]     Sent from :[facility]                   Phone:[phone]                   [phone]   Sent by :    Harlon Flor BSN,           RN

## 2020-10-05 NOTE — Progress Notes (Signed)
COVID Vaccine Completed: Date COVID Vaccine completed: COVID vaccine manufacturer: [employer]  Date of COVID positive in last 90 days:  PCP - Dr. Seward Carol Cardiologist - N/A  Chest x-ray - greater than 1 year in care everywhere EKG - 07/20/20 in epic Stress Test - N/A ECHO - N/A Cardiac Cath - N/A Pacemaker/ICD device last checked:N/A  Sleep Study - N/A CPAP - N/A  Fasting Blood Sugar - N/A Checks Blood Sugar __N/A___ times a day  Blood Thinner Instructions: N/A Aspirin Instructions: Last Dose:  Activity level:  Wheelchair, propels himself     Anesthesia review: N/A  Patient denies shortness of breath, fever, cough and chest pain at PAT appointment   Patient verbalized understanding of instructions that were given to them at the PAT appointment. Patient was also instructed that they will need to review over the PAT instructions again at home before surgery.

## 2020-10-06 DIAGNOSIS — F339 Major depressive disorder, recurrent, unspecified: Secondary | ICD-10-CM | POA: Diagnosis not present

## 2020-10-11 MED ORDER — DEXTROSE 5 % IV SOLN
3.0000 g | Freq: Once | INTRAVENOUS | Status: DC
  Filled 2020-10-11: qty 3000

## 2020-10-12 ENCOUNTER — Encounter (HOSPITAL_COMMUNITY): Payer: Self-pay | Admitting: Urology

## 2020-10-12 ENCOUNTER — Encounter (HOSPITAL_COMMUNITY)
Admission: RE | Disposition: A | Discharge status: Home / Self Care | Payer: Self-pay | Source: Home / Self Care | Attending: Urology

## 2020-10-12 ENCOUNTER — Ambulatory Visit (HOSPITAL_COMMUNITY): Payer: Medicare HMO | Admitting: Anesthesiology

## 2020-10-12 ENCOUNTER — Ambulatory Visit (HOSPITAL_COMMUNITY)
Admission: RE | Admit: 2020-10-12 | Discharge: 2020-10-12 | Disposition: A | Discharge status: Home / Self Care | Payer: Medicare HMO | Attending: Urology | Admitting: Urology

## 2020-10-12 DIAGNOSIS — Z79899 Other long term (current) drug therapy: Secondary | ICD-10-CM | POA: Insufficient documentation

## 2020-10-12 DIAGNOSIS — N318 Other neuromuscular dysfunction of bladder: Secondary | ICD-10-CM | POA: Diagnosis not present

## 2020-10-12 DIAGNOSIS — N312 Flaccid neuropathic bladder, not elsewhere classified: Secondary | ICD-10-CM

## 2020-10-12 DIAGNOSIS — Z8546 Personal history of malignant neoplasm of prostate: Secondary | ICD-10-CM | POA: Diagnosis not present

## 2020-10-12 DIAGNOSIS — Z888 Allergy status to other drugs, medicaments and biological substances status: Secondary | ICD-10-CM | POA: Insufficient documentation

## 2020-10-12 DIAGNOSIS — Z7982 Long term (current) use of aspirin: Secondary | ICD-10-CM | POA: Diagnosis not present

## 2020-10-12 DIAGNOSIS — C61 Malignant neoplasm of prostate: Secondary | ICD-10-CM | POA: Diagnosis not present

## 2020-10-12 DIAGNOSIS — I10 Essential (primary) hypertension: Secondary | ICD-10-CM | POA: Diagnosis not present

## 2020-10-12 DIAGNOSIS — U071 COVID-19: Secondary | ICD-10-CM | POA: Diagnosis not present

## 2020-10-12 DIAGNOSIS — Z885 Allergy status to narcotic agent status: Secondary | ICD-10-CM | POA: Diagnosis not present

## 2020-10-12 DIAGNOSIS — R338 Other retention of urine: Secondary | ICD-10-CM | POA: Diagnosis not present

## 2020-10-12 DIAGNOSIS — K219 Gastro-esophageal reflux disease without esophagitis: Secondary | ICD-10-CM | POA: Diagnosis not present

## 2020-10-12 DIAGNOSIS — M069 Rheumatoid arthritis, unspecified: Secondary | ICD-10-CM | POA: Diagnosis not present

## 2020-10-12 HISTORY — PX: CYSTOSCOPY: SHX5120

## 2020-10-12 HISTORY — DX: Presence of dental prosthetic device (complete) (partial): Z97.2

## 2020-10-12 HISTORY — DX: Personal history of COVID-19: Z86.16

## 2020-10-12 LAB — BASIC METABOLIC PANEL
Anion gap: 8 (ref 5–15)
BUN: 10 mg/dL (ref 8–23)
CO2: 27 mmol/L (ref 22–32)
Calcium: 8.8 mg/dL — ABNORMAL LOW (ref 8.9–10.3)
Chloride: 105 mmol/L (ref 98–111)
Creatinine, Ser: 1.51 mg/dL — ABNORMAL HIGH (ref 0.61–1.24)
GFR, Estimated: 50 mL/min — ABNORMAL LOW (ref >60)
Glucose, Bld: 88 mg/dL (ref 70–99)
Potassium: 4 mmol/L (ref 3.5–5.1)
Sodium: 140 mmol/L (ref 135–145)

## 2020-10-12 LAB — CBC
HCT: 41.6 % (ref 39.0–52.0)
Hemoglobin: 13 g/dL (ref 13.0–17.0)
MCH: 27.7 pg (ref 26.0–34.0)
MCHC: 31.3 g/dL (ref 30.0–36.0)
MCV: 88.7 fL (ref 80.0–100.0)
Platelets: 179 10*3/uL (ref 150–400)
RBC: 4.69 MIL/uL (ref 4.22–5.81)
RDW: 13.1 % (ref 11.5–15.5)
WBC: 5 10*3/uL (ref 4.0–10.5)
nRBC: 0 % (ref 0.0–0.2)

## 2020-10-12 LAB — SARS CORONAVIRUS 2 BY RT PCR (HOSPITAL ORDER, PERFORMED IN ~~LOC~~ HOSPITAL LAB): SARS Coronavirus 2: POSITIVE — AB

## 2020-10-12 SURGERY — CYSTOSCOPY
Anesthesia: General

## 2020-10-12 MED ORDER — SUGAMMADEX SODIUM 200 MG/2ML IV SOLN
INTRAVENOUS | Status: DC | PRN
  Administered 2020-10-12: 250 mg via INTRAVENOUS

## 2020-10-12 MED ORDER — ORAL CARE MOUTH RINSE: 15.0000 mL | Freq: Once | OROMUCOSAL | Status: AC

## 2020-10-12 MED ORDER — FENTANYL CITRATE (PF) 100 MCG/2ML IJ SOLN
INTRAMUSCULAR | Status: AC
  Filled 2020-10-12: qty 2

## 2020-10-12 MED ORDER — MIDAZOLAM HCL 2 MG/2ML IJ SOLN
INTRAMUSCULAR | Status: AC
  Filled 2020-10-12: qty 2

## 2020-10-12 MED ORDER — LACTATED RINGERS IV SOLN: INTRAVENOUS | Status: DC

## 2020-10-12 MED ORDER — LIDOCAINE HCL (PF) 2 % IJ SOLN
INTRAMUSCULAR | Status: AC
  Filled 2020-10-12: qty 5

## 2020-10-12 MED ORDER — PHENYLEPHRINE 40 MCG/ML (10ML) SYRINGE FOR IV PUSH (FOR BLOOD PRESSURE SUPPORT)
PREFILLED_SYRINGE | INTRAVENOUS | Status: DC | PRN
  Administered 2020-10-12: 160 ug via INTRAVENOUS

## 2020-10-12 MED ORDER — ONDANSETRON HCL 4 MG/2ML IJ SOLN
INTRAMUSCULAR | Status: AC
  Filled 2020-10-12: qty 2

## 2020-10-12 MED ORDER — BUPIVACAINE HCL (PF) 0.5 % IJ SOLN
INTRAMUSCULAR | Status: DC | PRN
  Administered 2020-10-12: 30 mL

## 2020-10-12 MED ORDER — DEXAMETHASONE SODIUM PHOSPHATE 10 MG/ML IJ SOLN
INTRAMUSCULAR | Status: DC | PRN
  Administered 2020-10-12: 10 mg via INTRAVENOUS

## 2020-10-12 MED ORDER — LIDOCAINE HCL 2 % IJ SOLN
INTRAMUSCULAR | Status: AC
  Filled 2020-10-12: qty 20

## 2020-10-12 MED ORDER — SODIUM CHLORIDE 0.9 % IR SOLN
Status: DC | PRN
  Administered 2020-10-12: 1000 mL

## 2020-10-12 MED ORDER — DEXAMETHASONE SODIUM PHOSPHATE 10 MG/ML IJ SOLN
INTRAMUSCULAR | Status: AC
  Filled 2020-10-12: qty 1

## 2020-10-12 MED ORDER — ROCURONIUM BROMIDE 10 MG/ML (PF) SYRINGE
PREFILLED_SYRINGE | INTRAVENOUS | Status: DC | PRN
  Administered 2020-10-12: 60 mg via INTRAVENOUS

## 2020-10-12 MED ORDER — ACETAMINOPHEN 500 MG PO TABS
ORAL_TABLET | ORAL | Status: AC
  Administered 2020-10-12: 1000 mg via ORAL
  Filled 2020-10-12: qty 2

## 2020-10-12 MED ORDER — ACETAMINOPHEN 500 MG PO TABS: 1000.0000 mg | ORAL_TABLET | Freq: Once | ORAL | Status: AC

## 2020-10-12 MED ORDER — FENTANYL CITRATE (PF) 100 MCG/2ML IJ SOLN
INTRAMUSCULAR | Status: DC | PRN
  Administered 2020-10-12: 50 ug via INTRAVENOUS
  Administered 2020-10-12: 100 ug via INTRAVENOUS
  Administered 2020-10-12: 50 ug via INTRAVENOUS

## 2020-10-12 MED ORDER — BUPIVACAINE HCL (PF) 0.5 % IJ SOLN
INTRAMUSCULAR | Status: AC
  Filled 2020-10-12: qty 30

## 2020-10-12 MED ORDER — SODIUM CHLORIDE 0.9 % IR SOLN
Status: DC | PRN
  Administered 2020-10-12: 3000 mL

## 2020-10-12 MED ORDER — CHLORHEXIDINE GLUCONATE 0.12 % MT SOLN
15.0000 mL | Freq: Once | OROMUCOSAL | Status: AC
  Administered 2020-10-12: 15 mL via OROMUCOSAL

## 2020-10-12 MED ORDER — FENTANYL CITRATE (PF) 100 MCG/2ML IJ SOLN
25.0000 ug | INTRAMUSCULAR | Status: DC | PRN
  Administered 2020-10-12 (×2): 50 ug via INTRAVENOUS

## 2020-10-12 MED ORDER — SUGAMMADEX SODIUM 500 MG/5ML IV SOLN
INTRAVENOUS | Status: AC
  Filled 2020-10-12: qty 5

## 2020-10-12 MED ORDER — ONDANSETRON HCL 4 MG/2ML IJ SOLN: 4.0000 mg | Freq: Once | INTRAMUSCULAR | Status: DC | PRN

## 2020-10-12 MED ORDER — ONDANSETRON HCL 4 MG/2ML IJ SOLN
INTRAMUSCULAR | Status: DC | PRN
  Administered 2020-10-12: 4 mg via INTRAVENOUS

## 2020-10-12 MED ORDER — LIDOCAINE 2% (20 MG/ML) 5 ML SYRINGE
INTRAMUSCULAR | Status: DC | PRN
  Administered 2020-10-12: 80 mg via INTRAVENOUS

## 2020-10-12 MED ORDER — PROPOFOL 10 MG/ML IV BOLUS
INTRAVENOUS | Status: AC
  Filled 2020-10-12: qty 20

## 2020-10-12 MED ORDER — CEFAZOLIN SODIUM-DEXTROSE 2-4 GM/100ML-% IV SOLN
2.0000 g | Freq: Once | INTRAVENOUS | Status: AC
  Administered 2020-10-12: 2 g via INTRAVENOUS

## 2020-10-12 MED ORDER — CEFAZOLIN SODIUM-DEXTROSE 2-4 GM/100ML-% IV SOLN
INTRAVENOUS | Status: AC
  Filled 2020-10-12: qty 100

## 2020-10-12 MED ORDER — PROPOFOL 10 MG/ML IV BOLUS
INTRAVENOUS | Status: DC | PRN
  Administered 2020-10-12: 100 mg via INTRAVENOUS

## 2020-10-12 SURGICAL SUPPLY — 28 items
BAG URINE DRAIN 2000ML AR STRL (UROLOGICAL SUPPLIES) ×3 IMPLANT
BAG URO CATCHER STRL LF (MISCELLANEOUS) ×3 IMPLANT
BLADE CLIPPER SURG (BLADE) IMPLANT
CATH FOLEY 2WAY  5CC 16FR SIL (CATHETERS) ×2
CATH FOLEY 2WAY 5CC 16FR SIL (CATHETERS) ×1 IMPLANT
CATH FOLEY 2WAY SLVR  5CC 16FR (CATHETERS)
CATH FOLEY 2WAY SLVR  5CC 18FR (CATHETERS)
CATH FOLEY 2WAY SLVR 5CC 16FR (CATHETERS) IMPLANT
CATH FOLEY 2WAY SLVR 5CC 18FR (CATHETERS) IMPLANT
CATH FOLEY INTRO SUPRA 16F (CATHETERS) ×3 IMPLANT
COVER SURGICAL LIGHT HANDLE (MISCELLANEOUS) ×3 IMPLANT
ELECT REM PT RETURN 15FT ADLT (MISCELLANEOUS) ×3 IMPLANT
GAUZE SPONGE 4X4 12PLY STRL (GAUZE/BANDAGES/DRESSINGS) ×3 IMPLANT
GLOVE SURG ENC MOIS LTX SZ8 (GLOVE) ×3 IMPLANT
GOWN STRL REUS W/TWL LRG LVL3 (GOWN DISPOSABLE) ×6 IMPLANT
HOLDER FOLEY CATH W/STRAP (MISCELLANEOUS) ×3 IMPLANT
IV NS IRRIG 3000ML ARTHROMATIC (IV SOLUTION) ×3 IMPLANT
KIT TURNOVER KIT A (KITS) ×3 IMPLANT
NEEDLE HYPO 25X1 1.5 SAFETY (NEEDLE) ×3 IMPLANT
PACK CYSTO (CUSTOM PROCEDURE TRAY) ×3 IMPLANT
PENCIL HANDSWITCHING (ELECTRODE) ×3 IMPLANT
SUT SILK 0 (SUTURE) ×2
SUT SILK 0 30XBRD TIE 6 (SUTURE) ×1 IMPLANT
SUT SILK 2 0 PERMA HAND 18 BK (SUTURE) ×3 IMPLANT
SUT VIC AB 3-0 SH 27 (SUTURE) ×2
SUT VIC AB 3-0 SH 27XBRD (SUTURE) ×1 IMPLANT
SYR TOOMEY IRRIG 70ML (MISCELLANEOUS)
SYRINGE TOOMEY IRRIG 70ML (MISCELLANEOUS) IMPLANT

## 2020-10-12 NOTE — Anesthesia Procedure Notes (Signed)
Procedure Name: Intubation Date/Time: 10/12/2020 1:10 PM Performed by: Sharlette Dense, CRNA Pre-anesthesia Checklist: Patient identified Patient Re-evaluated:Patient Re-evaluated prior to induction Oxygen Delivery Method: Circle system utilized Preoxygenation: Pre-oxygenation with 100% oxygen Induction Type: IV induction Ventilation: Mask ventilation without difficulty Laryngoscope Size: Miller and 3 Grade View: Grade I Tube type: Oral Tube size: 8.0 mm Number of attempts: 1 Airway Equipment and Method: Stylet Placement Confirmation: ETT inserted through vocal cords under direct vision,  positive ETCO2 and breath sounds checked- equal and bilateral Secured at: 22 cm Tube secured with: Tape Dental Injury: Teeth and Oropharynx as per pre-operative assessment

## 2020-10-12 NOTE — Progress Notes (Signed)
Spoke with Derrill Kay is transporter for [facility] reported would come pick patient up aware he is ready. Attempted call report could not get anyone to answer at nurses station Lennette Bihari reported would give them my phone number so I could give them report.  Patient is pending discharge s/p supra pubic f/c placement today.

## 2020-10-12 NOTE — Progress Notes (Signed)
Patient stated that he tested positive for Covid in [place] at [facility] and they put him in quarantine for 10 days, patient states I never got sick.   Called [facility] to confirm if patient tested positive in [place] for Covid. Spoke to floor nurse and she confirmed that patient was positive in January, could not give exact date, and that patient was placed in quarantine for 10 days.   I requested the result be faxed to Short Stay and she stated she was not near a computer and busy passing meds. Informed her of the importance of receiving the result, she states she would try and send the result.  The fax number was given to her.

## 2020-10-12 NOTE — Transfer of Care (Signed)
Immediate Anesthesia Transfer of Care Note  Patient: Jeffrey Key  Procedure(s) Performed: CYSTOSCOPY WITH SUPRA PUBIC TUBE PLACEMENT (N/A )  Patient Location: PACU  Anesthesia Type:General  Level of Consciousness: drowsy  Airway & Oxygen Therapy: Patient Spontanous Breathing and Patient connected to face mask oxygen  Post-op Assessment: Report given to RN and Post -op Vital signs reviewed and stable  Post vital signs: Reviewed and stable  Last Vitals:  Vitals Value Taken Time  BP 138/91 10/12/20 1423  Temp    Pulse 84 10/12/20 1425  Resp 13 10/12/20 1425  SpO2 100 % 10/12/20 1425  Vitals shown include unvalidated device data.  Last Pain:  Vitals:   10/12/20 1127  TempSrc:   PainSc: 4       Patients Stated Pain Goal: 3 (24/93/24 1991)  Complications: No complications documented.

## 2020-10-12 NOTE — Op Note (Signed)
Operative Note  Preoperative diagnosis:  1.  Areflexic bladder  Postoperative diagnosis: 1.  Areflexic bladder  Procedure(s): 1.  Cystoscopy 2. Open suprapubic catheter placement  Surgeon: Rexene Alberts, MD  Assistants:  None  Anesthesia:  General  Complications:  None  EBL:  Minimal  Specimens: 1. None  Drains/Catheters: 1.  16 Fr suprapubic catheter  Intraoperative findings:   1. Cystoscopy demonstrated irritated mucosa throughout the bladder consistent with chronic indwelling foley placement. After bladder distension, he did have some friable areas throughout the bladder that had some slight oozing. There were no areas suspicious of malignancy. At the completion of the case, hemostasis was ensured. 2. Successful placement of 16 Fr suprapubic catheter.  Indication:  Jeffrey Key is a 70 y.o. male with a history of localized prostate cancer on active surveillance and an areflexive bladder. He presents today for SPT placement.  Description of procedure: After informed consent was obtained from the patient, the patient was identified and taken to the operating room and placed in the supine position.  General anesthesia was administered as well as perioperative IV antibiotics.  At the beginning of the case, a time-out was performed to properly identify the patient, the surgery to be performed, and the surgical site.  Sequential compression devices were applied to the lower extremities at the beginning of the case for DVT prophylaxis.  The patient was then placed in the dorsal lithotomy supine position, prepped and draped in sterile fashion.  We passed the 21-French rigid cystoscope through the urethra and into the bladder under vision without any difficulty, noting a normal urethra without strictures and a non-obstructing prostate.  A systematic evaluation of the bladder revealed no evidence of any suspicious bladder lesions.  Ureteral orifices were in normal position.  He did have  irritation throughout the mucosa from chronic foley catheter placement. There were no concerning malignant appearing lesions.  The patient was placed into Trendelenburg position.  After placing the patient into Trendelenburg, we located his pubic symphysis.  Next, a 1 cm vertical incision was performed 2 fingerbreadths superior to the pubic symphysis.  We then introduced and spread the subcutaneous tissues using a Kelly. The fascia was opened with a 15 blade.  We then introduced a spinal needle into the bladder and noted this was introducing into the midline on the dome of the bladder with good urine flow out of the needle. At this point with the distension of the bladder, there was some minor oozing from various sites.  We then removed the spinal needle and then introduced the trocar into the bladder. A 16-French Foley catheter was placed through the trocar into the bladder. We then injected 10 mL of sterile water into the balloon.  The Foley was sutured in place using 2-0 silk sutures. The subcutaneous tissues were then closed with vicryl. We applied gauze dressing and tape over the site. The patient tolerated the procedure well and there was no complication. Patient was awoken from anesthesia and taken to the recovery room in stable condition. I was present and scrubbed for the entirety of the case.  Plan:  Discharge home. F/u in the office as scheduled. He will have his first SPT exchange performed in the office in 1 month.  Matt [facility]  Pager: [phone]

## 2020-10-12 NOTE — Anesthesia Preprocedure Evaluation (Addendum)
Anesthesia Evaluation  Patient identified by MRN, date of birth, ID band Patient awake    Reviewed: Allergy & Precautions, NPO status , Patient's Chart, lab work & pertinent test results, reviewed documented beta blocker date and time   Airway Mallampati: II  TM Distance: >3 FB Neck ROM: Full    Dental  (+) Dental Advisory Given, Edentulous Upper, Edentulous Lower   Pulmonary neg pulmonary ROS,    Pulmonary exam normal breath sounds clear to auscultation       Cardiovascular hypertension, Pt. on medications and Pt. on home beta blockers Normal cardiovascular exam Rhythm:Regular Rate:Normal     Neuro/Psych PSYCHIATRIC DISORDERS Depression negative neurological ROS     GI/Hepatic Neg liver ROS, GERD  Medicated and Controlled,  Endo/Other  Obesity   Renal/GU negative Renal ROS Bladder dysfunction  AREFLEXIC BLADDER    Musculoskeletal  (+) Arthritis , Rheumatoid disorders,    Abdominal   Peds  Hematology negative hematology ROS (+)   Anesthesia Other Findings Day of surgery medications reviewed with the patient.  Reproductive/Obstetrics                            Anesthesia Physical Anesthesia Plan  ASA: III  Anesthesia Plan: General   Post-op Pain Management:    Induction: Intravenous  PONV Risk Score and Plan: 3 and Dexamethasone and Ondansetron  Airway Management Planned: Oral ETT  Additional Equipment:   Intra-op Plan:   Post-operative Plan: Extubation in OR  Informed Consent: I have reviewed the patients History and Physical, chart, labs and discussed the procedure including the risks, benefits and alternatives for the proposed anesthesia with the patient or authorized representative who has indicated his/her understanding and acceptance.     Dental advisory given  Plan Discussed with: CRNA  Anesthesia Plan Comments:         Anesthesia Quick Evaluation

## 2020-10-12 NOTE — Discharge Instructions (Signed)
   Activity:  You are encouraged to ambulate frequently (about every hour during waking hours) to help prevent blood clots from forming in your legs or lungs.     Diet: You should advance your diet as instructed by your physician.  It will be normal to have some bloating, nausea, and abdominal discomfort intermittently.   Prescriptions:  You will be provided a prescription for pain medication to take as needed.  If your pain is not severe enough to require the prescription pain medication, you may take extra strength Tylenol instead which will have less side effects.  You should also take a prescribed stool softener to avoid straining with bowel movements as the prescription pain medication may constipate you.   What to call us about: You should call the office [phone]) if you develop fever > 101 or develop persistent vomiting. Activity:  You are encouraged to ambulate frequently (about every hour during waking hours) to help prevent blood clots from forming in your legs or lungs.    You have a suprapubic catheter in place. Empty this bag regularly. Leave in place. You will followup in clinic in 1 month as scheduled for SPT exchange.

## 2020-10-12 NOTE — Anesthesia Postprocedure Evaluation (Signed)
Anesthesia Post Note  Patient: Jeffrey Key  Procedure(s) Performed: CYSTOSCOPY WITH SUPRA PUBIC TUBE PLACEMENT (N/A )     Patient location during evaluation: PACU Anesthesia Type: General Level of consciousness: awake and alert Pain management: pain level controlled Vital Signs Assessment: post-procedure vital signs reviewed and stable Respiratory status: spontaneous breathing, nonlabored ventilation, respiratory function stable and patient connected to nasal cannula oxygen Cardiovascular status: blood pressure returned to baseline and stable Postop Assessment: no apparent nausea or vomiting Anesthetic complications: no   No complications documented.  Last Vitals:  Vitals:   10/12/20 1607 10/12/20 1700  BP: (!) 132/95 (!) 134/94  Pulse: 72 98  Resp: 20 20  Temp: 36.4 C 36.5 C  SpO2:  100%    Last Pain:  Vitals:   10/12/20 1700  TempSrc:   PainSc: 0-No pain                 Catalina Gravel

## 2020-10-12 NOTE — H&P (Signed)
Office Visit Report     08/16/2020   --------------------------------------------------------------------------------   Willi Borowiak  MRN: [id number]  DOB: 1951/03/09, 70 year old Male  SSN:    PRIMARY CARE:    REFERRING:  Janith Lima, MD  PROVIDER:  Rexene Alberts, M.D.  LOCATION:  [facility] [phone]     --------------------------------------------------------------------------------   CC/HPI: Demaurion Dicioccio is a 70 year old male seen in follow-up with localized prostate cancer and areflexic bladder.   He did obtain urology care at [place] most previously with Dr. Ree Edman and was last seen by them 07/2019.   He was diagnosed in [place] in 05/06/2019 with clinical stage cT1cNxMx Gleason 3+4 = 7 (5%) in 1/12 cores, Gleason 3+3 = 6 in 4/12 cores (40% - 90%). Prebiopsy PSA was 9.0 on 02/17/2019 with a percent free PSA of 10. He did visit with radiation oncology in [facility] who recommended active surveillance. He also advised a TURP prior to radiation and stated that he would be a candidate for radiation therapy in the future if he had a prior TURP.  I performed a confirmatory biopsy which was done in the OR per his request on 07/20/2020 which revealed clinical stage cT1cNxMx Gleason score 3+4 = 7 and 3/12 cores (30-60%). He denies bone pain or unexpected weight loss. He would like to be continued on active surveillance at this time.   UDS on 08/02/2020 with max capacity of 640 mL with loss of compliance with an filling pressure of 24cmH20. He was unable to generate a detrusor contraction. He did have overflow leakage. Foley catheter was placed on 08/02/2020.   Patient currently denies fever, chills, sweats, nausea, vomiting, abdominal or flank pain, gross hematuria or dysuria.     ALLERGIES: tramadol Trazadone    MEDICATIONS: Aspirin 81 mg tablet,chewable  Finasteride 5 mg tablet  Omeprazole 20 mg capsule,delayed release  Tamsulosin Hcl 0.4 mg  capsule  Diclofenac Sodium 1 % gel  Gabapentin 400 mg capsule  Hydroxychloroquine Sulfate 200 mg tablet  Multivitamin  Nortriptyline Hcl 75 mg capsule  Triamcinolone Acetonide 0.5 % cream  Vitamin D3 25 mcg (1,000 unit) tablet,chewable     GU PSH: Complex cystometrogram, w/ void pressure and urethral pressure profile studies, any technique - 08/02/2020 Complex Uroflow - 08/02/2020 Emg surf Electrd - 08/02/2020 Inject For cystogram - 08/02/2020 Intrabd voidng Press - 08/02/2020 Prostate Needle Biopsy - 07/20/2020     NON-GU PSH: Back Surgery (Unspecified) Cholecystectomy (laparoscopic)     GU PMH: Incomplete bladder emptying - 08/02/2020, - 06/14/2020, - 05/17/2020 Prostate Cancer - 07/30/2020, - 06/14/2020, - 05/17/2020    NON-GU PMH: GERD Hypertension    FAMILY HISTORY: None    Notes: No FH of GU malignancy   SOCIAL HISTORY: Marital Status: Single Preferred Language: English; Ethnicity: Not Hispanic Or Latino; Race: White Current Smoking Status: Patient has never smoked.   Tobacco Use Assessment Completed: Used Tobacco in last 30 days? Has never drank.  Does not drink caffeine.    REVIEW OF SYSTEMS:    GU Review Male:   Patient denies frequent urination, hard to postpone urination, burning/ pain with urination, get up at night to urinate, leakage of urine, stream starts and stops, trouble starting your stream, have to strain to urinate , erection problems, and penile pain.  Gastrointestinal (Upper):   Patient denies nausea, vomiting, and indigestion/ heartburn.  Gastrointestinal (Lower):   Patient denies diarrhea and constipation.  Constitutional:   Patient denies fever, night sweats, weight loss, and  fatigue.  Skin:   Patient denies skin rash/ lesion and itching.  Eyes:   Patient denies blurred vision and double vision.  Ears/ Nose/ Throat:   Patient denies sore throat and sinus problems.  Hematologic/Lymphatic:   Patient denies swollen glands and easy bruising.   Cardiovascular:   Patient denies leg swelling and chest pains.  Respiratory:   Patient denies cough and shortness of breath.  Endocrine:   Patient denies excessive thirst.  Musculoskeletal:   Patient denies back pain and joint pain.  Neurological:   Patient denies headaches and dizziness.  Psychologic:   Patient denies depression and anxiety.   VITAL SIGNS: None   MULTI-SYSTEM PHYSICAL EXAMINATION:    Constitutional: Well-nourished. No physical deformities. Normally developed. Good grooming.  Respiratory: No labored breathing, no use of accessory muscles.   Cardiovascular: Normal temperature, normal extremity pulses, no swelling, no varicosities.  Gastrointestinal: No mass, no tenderness, no rigidity, non obese abdomen.     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 Clinical stage T1cNxMx GS 3+4 = 7 (Dx 05/06/2019 ), adenocarcinoma the prostate with 5/12 cores positive (1 core 3+4 (5%) and 4 cores 3+3 (40-90%), prostate volume unkown, prebiopsy PSA = 9.0ng/mL. Confirmatory biopsy which was done in the OR per his request on 07/20/2020 which revealed clinical stage cT1cNxMx Gleason score 3+4 = 7 and 3/12 cores (30-60%).  -Reviewed pathology report in detail with patient today. Explained that the patient has favorable intermediate risk disease based on his pathology report.  -Discussed treatment options for localized prostate cancer to include RALP  vs XRT +/- ADT. He elects to continue active surveillance.   2. Urinary retention with areflexic bladder: UDS on 08/02/2020 with max capacity of 640 mL with loss of compliance with an filling pressure of 24cmH20. He was unable to generate a detrusor contraction. He did have overflow leakage. Foley catheter was placed on 08/02/2020. I discussed options for management including indwelling Foley catheter, CIC and SPT. He elects to proceed with SPT. We will arrange for this to be done in the OR. Unfortunately, given his areflexic bladder, I told him that a bladder outlet procedure would not benefit his voiding status.   CC: Cyndi Bender         Next Appointment:      Next Appointment: 08/29/2020 01:30 PM    Appointment Type: Nurse Visit NO Urine    Location: [facility] [phone]    Provider: Rudyard Adrian    Reason for Visit: 2 wk nv- foley exchange       Signed by Rexene Alberts, M.D. on 08/16/20 at 4:46 PM (EST)  Urology Preoperative H&P  Chief Complaint: areflexic bladder   History of Present Illness: Jeffrey Key is a 70 y.o. male with an areflexic bladder here for SPT placement. He denies fevers or chills.    Past Medical History:  Diagnosis Date  . BPH (benign prostatic hyperplasia)   . Chronic pain   . Cognitive communication deficit   . 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 Depression   . GERD (gastroesophageal reflux disease)   . History of COVID-19   . Hypertension   . Rheumatoid arthritis ([place])   . Wears dentures     Past Surgical History:  Procedure Laterality Date  . PROSTATE BIOPSY N/A 07/20/2020   Procedure: BIOPSY TRANSRECTAL ULTRASONIC PROSTATE (TUBP);  Surgeon: Janith Lima, MD;  Location: [facility];  Service: Urology;  Laterality: N/A;  ONLY NEEDS 30 MIN    Allergies: No Known Allergies  No family history on file.  Social History:  has no history on file for tobacco use, alcohol use, and drug  use.  ROS: A complete review of systems was performed.  All systems are negative except for pertinent findings as noted.  Physical Exam:  Vital signs in last 24 hours:   Constitutional:  Alert and oriented, No acute distress Cardiovascular: Regular rate and rhythm Respiratory: Normal respiratory effort, Lungs clear bilaterally GI: Abdomen is soft, nontender, nondistended, no abdominal masses GU: No CVA tenderness Lymphatic: No lymphadenopathy Neurologic: Grossly intact, no focal deficits Psychiatric: Normal mood and affect  Laboratory Data:  No results for input(s): WBC, HGB, HCT, PLT in the last 72 hours.  No results for input(s): NA, K, CL, GLUCOSE, BUN, CALCIUM, CREATININE in the last 72 hours.  Invalid input(s): CO3   No results found for this or any previous visit (from the past 24 hour(s)). No results found for this or any previous visit (from the past 240 hour(s)).  Renal Function: No results for input(s): CREATININE in the last 168 hours. CrCl cannot be calculated (Patient's most recent lab result is older than the maximum 21 days allowed.).  Radiologic Imaging: No results found.  I independently reviewed the above imaging studies.  Assessment and Plan Savaughn Karwowski is a 70 y.o. male with an areflexic bladder here for cystoscopy, SPT placement. We discussed risks and benefits including potential injury to surrounding structures including injury to intestines, bleeding and infection. He elects to proceed.  Matt R. Suhaila Troiano MD 10/12/2020, [facility] Pager: [phone]

## 2020-10-13 ENCOUNTER — Telehealth: Payer: Self-pay | Admitting: Physician Assistant

## 2020-10-13 NOTE — Telephone Encounter (Signed)
Called to discuss with patient about COVID-19 symptoms and the use of one of the available treatments for those with mild to moderate Covid symptoms and at a high risk of hospitalization.  Pt appears to qualify for outpatient treatment due to co-morbid conditions and/or a member of an at-risk group in accordance with the FDA Emergency Use Authorization.    Symptom onset: unknown  Patient currently reside at [facility]. Spoke with operated and told that patient is COVID positive. Seems test was done for pre op. Call back number given.   Leanor Kail, PAC

## 2020-10-15 ENCOUNTER — Encounter (HOSPITAL_COMMUNITY): Payer: Self-pay | Admitting: Urology

## 2020-10-15 DIAGNOSIS — F329 Major depressive disorder, single episode, unspecified: Secondary | ICD-10-CM | POA: Diagnosis not present

## 2020-10-15 DIAGNOSIS — R197 Diarrhea, unspecified: Secondary | ICD-10-CM | POA: Diagnosis not present

## 2020-10-15 DIAGNOSIS — G8929 Other chronic pain: Secondary | ICD-10-CM | POA: Diagnosis not present

## 2020-10-15 DIAGNOSIS — R339 Retention of urine, unspecified: Secondary | ICD-10-CM | POA: Diagnosis not present

## 2020-10-16 DIAGNOSIS — D649 Anemia, unspecified: Secondary | ICD-10-CM | POA: Diagnosis not present

## 2020-10-17 DIAGNOSIS — N400 Enlarged prostate without lower urinary tract symptoms: Secondary | ICD-10-CM | POA: Diagnosis not present

## 2020-10-17 DIAGNOSIS — R197 Diarrhea, unspecified: Secondary | ICD-10-CM | POA: Diagnosis not present

## 2020-10-17 DIAGNOSIS — G8929 Other chronic pain: Secondary | ICD-10-CM | POA: Diagnosis not present

## 2020-10-17 DIAGNOSIS — R339 Retention of urine, unspecified: Secondary | ICD-10-CM | POA: Diagnosis not present

## 2020-10-24 DIAGNOSIS — F339 Major depressive disorder, recurrent, unspecified: Secondary | ICD-10-CM | POA: Diagnosis not present

## 2020-11-08 DIAGNOSIS — F339 Major depressive disorder, recurrent, unspecified: Secondary | ICD-10-CM | POA: Diagnosis not present

## 2020-11-09 DIAGNOSIS — N312 Flaccid neuropathic bladder, not elsewhere classified: Secondary | ICD-10-CM | POA: Diagnosis not present

## 2020-11-09 DIAGNOSIS — R338 Other retention of urine: Secondary | ICD-10-CM | POA: Diagnosis not present

## 2020-11-15 DIAGNOSIS — N390 Urinary tract infection, site not specified: Secondary | ICD-10-CM | POA: Diagnosis not present

## 2020-11-15 DIAGNOSIS — N4889 Other specified disorders of penis: Secondary | ICD-10-CM | POA: Diagnosis not present

## 2020-11-15 DIAGNOSIS — F339 Major depressive disorder, recurrent, unspecified: Secondary | ICD-10-CM | POA: Diagnosis not present

## 2020-11-15 DIAGNOSIS — N400 Enlarged prostate without lower urinary tract symptoms: Secondary | ICD-10-CM | POA: Diagnosis not present

## 2020-11-15 DIAGNOSIS — Z13228 Encounter for screening for other metabolic disorders: Secondary | ICD-10-CM | POA: Diagnosis not present

## 2020-11-15 DIAGNOSIS — R109 Unspecified abdominal pain: Secondary | ICD-10-CM | POA: Diagnosis not present

## 2020-11-15 DIAGNOSIS — M069 Rheumatoid arthritis, unspecified: Secondary | ICD-10-CM | POA: Diagnosis not present

## 2020-11-15 DIAGNOSIS — F329 Major depressive disorder, single episode, unspecified: Secondary | ICD-10-CM | POA: Diagnosis not present

## 2020-11-15 DIAGNOSIS — G8929 Other chronic pain: Secondary | ICD-10-CM | POA: Diagnosis not present

## 2020-11-19 DIAGNOSIS — N390 Urinary tract infection, site not specified: Secondary | ICD-10-CM | POA: Diagnosis not present

## 2020-11-19 DIAGNOSIS — F4320 Adjustment disorder, unspecified: Secondary | ICD-10-CM | POA: Diagnosis not present

## 2020-11-19 DIAGNOSIS — N4889 Other specified disorders of penis: Secondary | ICD-10-CM | POA: Diagnosis not present

## 2020-11-19 DIAGNOSIS — G8929 Other chronic pain: Secondary | ICD-10-CM | POA: Diagnosis not present

## 2020-11-19 DIAGNOSIS — M069 Rheumatoid arthritis, unspecified: Secondary | ICD-10-CM | POA: Diagnosis not present

## 2020-11-19 DIAGNOSIS — F339 Major depressive disorder, recurrent, unspecified: Secondary | ICD-10-CM | POA: Diagnosis not present

## 2020-11-19 DIAGNOSIS — R300 Dysuria: Secondary | ICD-10-CM | POA: Diagnosis not present

## 2020-11-29 DIAGNOSIS — G8929 Other chronic pain: Secondary | ICD-10-CM | POA: Diagnosis not present

## 2020-11-29 DIAGNOSIS — M069 Rheumatoid arthritis, unspecified: Secondary | ICD-10-CM | POA: Diagnosis not present

## 2020-11-29 DIAGNOSIS — R339 Retention of urine, unspecified: Secondary | ICD-10-CM | POA: Diagnosis not present

## 2020-11-29 DIAGNOSIS — N390 Urinary tract infection, site not specified: Secondary | ICD-10-CM | POA: Diagnosis not present

## 2020-11-29 DIAGNOSIS — I10 Essential (primary) hypertension: Secondary | ICD-10-CM | POA: Diagnosis not present

## 2020-11-29 DIAGNOSIS — R300 Dysuria: Secondary | ICD-10-CM | POA: Diagnosis not present

## 2020-12-05 DIAGNOSIS — M0520 Rheumatoid vasculitis with rheumatoid arthritis of unspecified site: Secondary | ICD-10-CM | POA: Diagnosis not present

## 2020-12-05 DIAGNOSIS — N400 Enlarged prostate without lower urinary tract symptoms: Secondary | ICD-10-CM | POA: Diagnosis not present

## 2020-12-05 DIAGNOSIS — C61 Malignant neoplasm of prostate: Secondary | ICD-10-CM | POA: Diagnosis not present

## 2020-12-05 DIAGNOSIS — I10 Essential (primary) hypertension: Secondary | ICD-10-CM | POA: Diagnosis not present

## 2020-12-05 DIAGNOSIS — M549 Dorsalgia, unspecified: Secondary | ICD-10-CM | POA: Diagnosis not present

## 2020-12-05 DIAGNOSIS — R569 Unspecified convulsions: Secondary | ICD-10-CM | POA: Diagnosis not present

## 2020-12-11 DIAGNOSIS — F339 Major depressive disorder, recurrent, unspecified: Secondary | ICD-10-CM | POA: Diagnosis not present

## 2021-01-07 ENCOUNTER — Emergency Department (HOSPITAL_BASED_OUTPATIENT_CLINIC_OR_DEPARTMENT_OTHER): Payer: Medicare Other | Admitting: Radiology

## 2021-01-07 ENCOUNTER — Encounter (HOSPITAL_BASED_OUTPATIENT_CLINIC_OR_DEPARTMENT_OTHER): Payer: Self-pay

## 2021-01-07 ENCOUNTER — Emergency Department (HOSPITAL_BASED_OUTPATIENT_CLINIC_OR_DEPARTMENT_OTHER)
Admission: EM | Admit: 2021-01-07 | Discharge: 2021-01-07 | Disposition: A | Discharge status: Home / Self Care | Payer: Medicare Other | Attending: Emergency Medicine | Admitting: Emergency Medicine

## 2021-01-07 DIAGNOSIS — M25559 Pain in unspecified hip: Secondary | ICD-10-CM

## 2021-01-07 DIAGNOSIS — M25551 Pain in right hip: Secondary | ICD-10-CM | POA: Insufficient documentation

## 2021-01-07 DIAGNOSIS — W19XXXA Unspecified fall, initial encounter: Secondary | ICD-10-CM | POA: Diagnosis not present

## 2021-01-07 DIAGNOSIS — I10 Essential (primary) hypertension: Secondary | ICD-10-CM | POA: Insufficient documentation

## 2021-01-07 DIAGNOSIS — Z8616 Personal history of COVID-19: Secondary | ICD-10-CM | POA: Insufficient documentation

## 2021-01-07 MED ORDER — KETOROLAC TROMETHAMINE 15 MG/ML IJ SOLN
15.0000 mg | Freq: Once | INTRAMUSCULAR | Status: AC
  Administered 2021-01-07: 15 mg via INTRAMUSCULAR
  Filled 2021-01-07: qty 1

## 2021-01-07 MED ORDER — METHOCARBAMOL 500 MG PO TABS: 500.0000 mg | ORAL_TABLET | Freq: Three times a day (TID) | ORAL | 0 refills | Status: AC | PRN

## 2021-01-07 MED ORDER — METHOCARBAMOL 1000 MG/10ML IJ SOLN
1000.0000 mg | Freq: Once | INTRAMUSCULAR | Status: AC
  Administered 2021-01-07: 1000 mg via INTRAMUSCULAR
  Filled 2021-01-07: qty 10

## 2021-01-07 NOTE — ED Provider Notes (Signed)
[facility] Emergency Department Provider Note MRN:  [id number]  Arrival date & time: 01/07/21     Chief Complaint   Fall   History of Present Illness   Jeffrey Key is a 70 y.o. year-old male with a history of chronic pain presenting to the ED with chief complaint of fall.  Fall yesterday, today with severe right hip pain.  Denies head trauma, no loss of consciousness.  No neck or back pain, no chest pain, no shortness of breath, no abdominal pain.  Denies any numbness or weakness to the arms or legs, no bowel or bladder dysfunction.  Isolated right hip pain, worse with motion or palpation.  Review of Systems  A complete 10 system review of systems was obtained and all systems are negative except as noted in the HPI and PMH.   Patient's Health History    Past Medical History:  Diagnosis Date  . BPH (benign prostatic hyperplasia)   . Chronic pain   . Cognitive communication deficit   . 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 Depression   . GERD (gastroesophageal reflux disease)   . History of COVID-19   . Hypertension   . Rheumatoid arthritis ([place])   . Wears dentures     Past Surgical History:  Procedure Laterality Date  . CYSTOSCOPY N/A 10/12/2020   Procedure: CYSTOSCOPY WITH SUPRA PUBIC TUBE PLACEMENT;  Surgeon: Janith Lima, MD;  Location: [facility];  Service: Urology;  Laterality: N/A;  ONLY NEEDS 30 MIN NEEDS SAME DAY COVID TEST DUE TO LIVING IN SKILLED NURSING  . PROSTATE BIOPSY N/A 07/20/2020   Procedure: BIOPSY TRANSRECTAL ULTRASONIC PROSTATE (TUBP);  Surgeon: Janith Lima, MD;  Location: [facility];  Service: Urology;  Laterality: N/A;  ONLY NEEDS 30 MIN    History reviewed. No pertinent family history.  Social History   Socioeconomic History  . Marital status: Single    Spouse name: Not on file  . Number of children: Not on file  . Years of education: Not on file  . Highest education level: Not on  file  Occupational History  . Not on file  Tobacco Use  . Smoking status: Unknown If Ever Smoked  . Smokeless tobacco: Never Used  Vaping Use  . Vaping Use: Unknown  Substance and Sexual Activity  . Alcohol use: Not on file  . Drug use: Not on file  . Sexual activity: Not on file  Other Topics Concern  . Not on file  Social History Narrative  . Not on file   Social Determinants of Health   Financial Resource Strain: Not on file  Food Insecurity: Not on file  Transportation Needs: Not on file  Physical Activity: Not on file  Stress: Not on file  Social Connections: Not on file  Intimate Partner Violence: Not on file     Physical Exam   Vitals:   01/07/21 0124  BP: (!) 142/93  Pulse: 81  Resp: 20  Temp: (!) 97.5 F (36.4 C)  SpO2: 97%    CONSTITUTIONAL: Well-appearing, NAD NEURO:  Alert and oriented x 3, no focal deficits EYES:  eyes equal and reactive ENT/NECK:  no LAD, no JVD CARDIO: Rate rate, well-perfused, normal S1 and S2 PULM:  CTAB no wheezing or rhonchi GI/GU:  normal bowel sounds, non-distended, non-tender MSK/SPINE:  No gross deformities, no edema, preserved range of motion of the right hip and leg, neurovascularly intact distally SKIN:  no rash, atraumatic PSYCH:  Appropriate speech and behavior  *Additional and/or pertinent findings included in MDM below  Diagnostic and Interventional Summary    EKG Interpretation  Date/Time:    Ventricular Rate:    PR Interval:    QRS Duration:   QT Interval:    QTC Calculation:   R Axis:     Text Interpretation:        Labs Reviewed - No data to display  DG HIP UNILAT WITH PELVIS 2-3 VIEWS RIGHT  Final Result      Medications  ketorolac (TORADOL) 15 MG/ML injection 15 mg (15 mg Intramuscular Given 01/07/21 0138)  methocarbamol (ROBAXIN) injection 1,000 mg (1,000 mg Intramuscular Given 01/07/21 0138)     Procedures  /  Critical Care Procedures  ED Course and Medical Decision Making  I have  reviewed the triage vital signs, the nursing notes, and pertinent available records from the EMR.  Listed above are laboratory and imaging tests that I personally ordered, reviewed, and interpreted and then considered in my medical decision making (see below for details).  Fall, hip pain, patient is writhing in bed, fully ranging the hip which would be inconsistent with fracture or dislocation.  The leg is neurovascularly intact.  Possibly muscle strain or spasm, there is also report from his facility that he may be pain seeking.  I reviewed his PMP, he uses buprenorphine daily.  Will avoid narcotics, x-ray pending.     Xray negative, pain much improved, appropriate for dc.  Barth Kirks. Sedonia Small, [place] [email or web address]  Final Clinical Impressions(s) / ED Diagnoses     ICD-10-CM   1. Hip pain  M25.559   2. Fall  W19.XXXA DG HIP UNILAT WITH PELVIS 2-3 VIEWS RIGHT    DG HIP UNILAT WITH PELVIS 2-3 VIEWS RIGHT    ED Discharge Orders         Ordered    methocarbamol (ROBAXIN) 500 MG tablet  Every 8 hours PRN        01/07/21 0237           Discharge Instructions Discussed with and Provided to Patient:     Discharge Instructions     You were evaluated in the Emergency Department and after careful evaluation, we did not find any emergent condition requiring admission or further testing in the hospital.  Your exam/testing today was overall reassuring.  X-ray without any broken bones.  Suspect muscle strain or spasm.  Use Tylenol or Motrin at home for pain.  Can use the Robaxin muscle relaxer as needed for more significant pain.  Please return to the Emergency Department if you experience any worsening of your condition.  Thank you for allowing [place] to be a part of your care.        [name], MD 01/07/21 [phone]

## 2021-01-07 NOTE — Discharge Instructions (Addendum)
You were evaluated in the Emergency Department and after careful evaluation, we did not find any emergent condition requiring admission or further testing in the hospital.  Your exam/testing today was overall reassuring.  X-ray without any broken bones.  Suspect muscle strain or spasm.  Use Tylenol or Motrin at home for pain.  Can use the Robaxin muscle relaxer as needed for more significant pain.  Please return to the Emergency Department if you experience any worsening of your condition.  Thank you for allowing [place] to be a part of your care.

## 2021-01-07 NOTE — ED Triage Notes (Signed)
Pt to ED from Assisted living facility with c/o fall that happened at 2000 on 5/7. Pt states that his left hip hurts and everything hurts. EMS and facility report that pt has extensive narcotic abuse hx and suspect he is seeking since the pt did not report the fall and it was unwitnessed.

## 2021-03-07 ENCOUNTER — Emergency Department (HOSPITAL_COMMUNITY)
Admission: EM | Admit: 2021-03-07 | Discharge: 2021-03-08 | Disposition: A | Discharge status: Home / Self Care | Payer: Medicare Other | Attending: Emergency Medicine | Admitting: Emergency Medicine

## 2021-03-07 ENCOUNTER — Encounter (HOSPITAL_COMMUNITY): Payer: Self-pay | Admitting: Emergency Medicine

## 2021-03-07 ENCOUNTER — Other Ambulatory Visit: Payer: Self-pay

## 2021-03-07 DIAGNOSIS — T83198A Other mechanical complication of other urinary devices and implants, initial encounter: Secondary | ICD-10-CM | POA: Insufficient documentation

## 2021-03-07 DIAGNOSIS — T83010A Breakdown (mechanical) of cystostomy catheter, initial encounter: Secondary | ICD-10-CM

## 2021-03-07 DIAGNOSIS — T83511A Infection and inflammatory reaction due to indwelling urethral catheter, initial encounter: Secondary | ICD-10-CM | POA: Insufficient documentation

## 2021-03-07 DIAGNOSIS — N390 Urinary tract infection, site not specified: Secondary | ICD-10-CM

## 2021-03-07 DIAGNOSIS — Z7982 Long term (current) use of aspirin: Secondary | ICD-10-CM | POA: Diagnosis not present

## 2021-03-07 DIAGNOSIS — Z8616 Personal history of COVID-19: Secondary | ICD-10-CM | POA: Diagnosis not present

## 2021-03-07 DIAGNOSIS — Z79899 Other long term (current) drug therapy: Secondary | ICD-10-CM | POA: Diagnosis not present

## 2021-03-07 DIAGNOSIS — I10 Essential (primary) hypertension: Secondary | ICD-10-CM | POA: Insufficient documentation

## 2021-03-07 LAB — URINALYSIS, ROUTINE W REFLEX MICROSCOPIC
Bilirubin Urine: NEGATIVE
Glucose, UA: NEGATIVE mg/dL
Ketones, ur: NEGATIVE mg/dL
Nitrite: NEGATIVE
Protein, ur: 100 mg/dL — AB
RBC / HPF: >50 RBC/hpf — ABNORMAL HIGH (ref 0–5)
Specific Gravity, Urine: 1.015 (ref 1.005–1.030)
pH: 7 (ref 5.0–8.0)

## 2021-03-07 MED ORDER — PHENAZOPYRIDINE HCL 200 MG PO TABS: 200.0000 mg | ORAL_TABLET | Freq: Three times a day (TID) | ORAL | 0 refills | Status: DC

## 2021-03-07 MED ORDER — CIPROFLOXACIN HCL 500 MG PO TABS: 500.0000 mg | ORAL_TABLET | Freq: Two times a day (BID) | ORAL | 0 refills | Status: DC

## 2021-03-07 MED ORDER — CIPROFLOXACIN HCL 500 MG PO TABS
500.0000 mg | ORAL_TABLET | Freq: Once | ORAL | Status: AC
  Administered 2021-03-07: 500 mg via ORAL
  Filled 2021-03-07: qty 1

## 2021-03-07 MED ORDER — PHENAZOPYRIDINE HCL 200 MG PO TABS: 200.0000 mg | ORAL_TABLET | Freq: Three times a day (TID) | ORAL | Status: DC

## 2021-03-07 MED ORDER — LIDOCAINE HCL URETHRAL/MUCOSAL 2 % EX GEL
1.0000 "application " | Freq: Once | CUTANEOUS | Status: AC
  Administered 2021-03-07: 1
  Filled 2021-03-07: qty 11

## 2021-03-07 NOTE — ED Notes (Signed)
[facility] called for transport back to [place].

## 2021-03-07 NOTE — ED Provider Notes (Signed)
[facility] DEPT Provider Note   CSN: [id number] Arrival date & time: 03/07/21  1729     History Chief Complaint  Patient presents with   G-tube Evaluation    Jeffrey Key is a 70 y.o. male.  Pt presents to the ED today with a block suprapubic urinary catheter.  Pt has a hx of an enlarged prostate.  He has had the suprapubic catheter for about 3 months (per Epic, it was placed in Feb).  Pt has a bad pain when he tries to urinate.  Some urine is coming out of his penis.      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   Past Surgical History:  Procedure Laterality Date   CYSTOSCOPY N/A 10/12/2020   Procedure: CYSTOSCOPY WITH SUPRA PUBIC TUBE PLACEMENT;  Surgeon: Janith Lima, MD;  Location: [facility];  Service: Urology;  Laterality: N/A;  ONLY NEEDS 30 MIN NEEDS SAME DAY COVID TEST DUE TO LIVING IN SKILLED NURSING   PROSTATE BIOPSY N/A 07/20/2020   Procedure: BIOPSY TRANSRECTAL ULTRASONIC PROSTATE (TUBP);  Surgeon: Janith Lima, MD;  Location: [facility];  Service: Urology;  Laterality: N/A;  ONLY NEEDS 30 MIN       No family history on file.  Social History   Tobacco Use   Smoking status: Unknown   Smokeless tobacco: Never  Vaping Use   Vaping Use: Unknown    Home Medications Prior to Admission medications   Medication Sig Start Date End Date Taking? Authorizing Provider  ciprofloxacin (CIPRO) 500 MG tablet Take 1 tablet (500 mg total) by mouth 2 (two) times daily. 03/07/21  Yes Isla Pence, MD  phenazopyridine (PYRIDIUM) 200 MG tablet Take 1 tablet (200 mg total) by mouth 3  (three) times daily. 03/07/21  Yes Isla Pence, MD  aspirin EC 81 MG tablet Take 81 mg by mouth daily. Swallow whole.    [provider]  buprenorphine (BUTRANS) 5 MCG/HR [place] onto the skin once a week.    [provider]  cholecalciferol (VITAMIN D3) 25 MCG (1000 UNIT) tablet Take 2,000 Units by mouth daily.    [provider]  diclofenac Sodium (VOLTAREN) 1 % GEL Apply 2 g topically every 8 (eight) hours as needed (pain in left elbow).    [provider]  finasteride (PROSCAR) 5 MG tablet Take 5 mg by mouth daily.    [provider]  gabapentin (NEURONTIN) 400 MG capsule Take 400 mg by mouth 3 (three) times daily.    [provider]  hydroxychloroquine (PLAQUENIL) 200 MG tablet Take 200 mg by mouth 2 (two) times daily.    [provider]  methocarbamol (ROBAXIN) 500 MG tablet Take 1 tablet (500 mg total) by mouth every 8 (eight) hours as needed for muscle spasms. 01/07/21   [name], MD  metoprolol succinate (TOPROL-XL) 50 MG 24 hr tablet Take 50 mg by mouth daily. Take with or immediately following a meal.    [provider]  nortriptyline (PAMELOR) 75 MG capsule Take 75 mg by mouth at  bedtime.    [provider]  omeprazole (PRILOSEC) 20 MG capsule Take 20 mg by mouth daily.    [provider]  polyethylene glycol (MIRALAX / GLYCOLAX) 17 g packet Take 17 g by mouth daily as needed.    [provider]  Propylene Glycol (SYSTANE COMPLETE OP) Apply 1 drop to eye in the morning and at bedtime. Both eyes for dry eye    [provider]  tamsulosin (FLOMAX) 0.4 MG CAPS capsule Take 0.4 mg by mouth daily after supper.    [provider]  triamcinolone cream (KENALOG) 0.5 % Apply 1 application topically 3 (three) times daily as needed (apply to itching areas).    [provider]    Allergies    Tramadol  Review of Systems   Review of Systems  Genitourinary:   Positive for dysuria.       Blocked catheter  All other systems reviewed and are negative.  Physical Exam Updated Vital Signs SpO2 95%   Physical Exam Vitals and nursing note reviewed. Exam conducted with a chaperone present.  Constitutional:      Appearance: Normal appearance.  HENT:     Head: Normocephalic and atraumatic.     Right Ear: External ear normal.     Left Ear: External ear normal.     Nose: Nose normal.     Mouth/Throat:     Mouth: Mucous membranes are moist.     Pharynx: Oropharynx is clear.  Eyes:     Extraocular Movements: Extraocular movements intact.     Conjunctiva/sclera: Conjunctivae normal.     Pupils: Pupils are equal, round, and reactive to light.  Cardiovascular:     Rate and Rhythm: Normal rate and regular rhythm.     Pulses: Normal pulses.     Heart sounds: Normal heart sounds.  Pulmonary:     Effort: Pulmonary effort is normal.     Breath sounds: Normal breath sounds.  Abdominal:     General: Abdomen is flat. Bowel sounds are normal.     Palpations: Abdomen is soft.  Genitourinary:    Comments: Suprapubic catheter is draining, but there is some pus visualized in the urine.  He is having leaking from his penis. Musculoskeletal:        General: Normal range of motion.     Cervical back: Normal range of motion and neck supple.  Skin:    General: Skin is warm.     Capillary Refill: Capillary refill takes less than 2 seconds.  Neurological:     General: No focal deficit present.     Mental Status: He is alert and oriented to person, place, and time.  Psychiatric:        Mood and Affect: Mood normal.        Behavior: Behavior normal.    ED Results / Procedures / Treatments   Labs (all labs ordered are listed, but only abnormal results are displayed) Labs Reviewed  URINALYSIS, ROUTINE W REFLEX MICROSCOPIC - Abnormal; Notable for the following components:      Result Value   APPearance CLOUDY (*)    Hgb urine dipstick LARGE (*)    Protein,  ur 100 (*)    Leukocytes,Ua LARGE (*)    RBC / HPF >50 (*)    Bacteria, UA RARE (*)    All other components within normal limits  URINE CULTURE    EKG None  Radiology No results found.  Procedures BLADDER CATHETERIZATION  Date/Time: 03/07/2021 7:02 PM  Performed by: Isla Pence, MD Authorized by: Isla Pence, MD   Consent:    Consent obtained:  Verbal   Consent given by:  Patient   Risks, benefits, and alternatives were discussed: yes     Alternatives discussed:  No treatment Universal protocol:    Patient identity confirmed:  Verbally with patient Pre-procedure details:    Procedure purpose:  Therapeutic Anesthesia:    Anesthesia method:  Topical application   Topical anesthetic:  Lidocaine gel Procedure details:    Provider performed due to:  Complicated insertion   Catheter insertion:  Indwelling   Catheter type:  Foley   Catheter size:  16 Fr   Bladder irrigation: no     Number of attempts:  1   Urine characteristics:  Cloudy Post-procedure details:    Procedure completion:  Tolerated well, no immediate complications Comments:     Catheter type:  suprapubic   Medications Ordered in ED Medications  ciprofloxacin (CIPRO) tablet 500 mg (has no administration in time range)  phenazopyridine (PYRIDIUM) tablet 200 mg (has no administration in time range)  lidocaine (XYLOCAINE) 2 % jelly 1 application (1 application Other Given by Other 03/07/21 1847)    ED Course  I have reviewed the triage vital signs and the nursing notes.  Pertinent labs & imaging results that were available during my care of the patient were reviewed by me and considered in my medical decision making (see chart for details).    MDM Rules/Calculators/A&P                          Pt's urine sent for culture.  He is to f/u with urology and is d/c with cipro.  Return if worse. Final Clinical Impression(s) / ED Diagnoses Final diagnoses:  Urinary tract infection associated with  indwelling urethral catheter, initial encounter ([place])  Suprapubic catheter dysfunction, initial encounter [facility])    Rx / DC Orders ED Discharge Orders          Ordered    ciprofloxacin (CIPRO) 500 MG tablet  2 times daily        03/07/21 2008    phenazopyridine (PYRIDIUM) 200 MG tablet  3 times daily        03/07/21 2008             Isla Pence, MD 03/07/21 2009

## 2021-03-07 NOTE — ED Triage Notes (Signed)
Pt BIB EMS from [place]. Pt requesting evaluation of his suprapubic g-tube - states he thinks it is "blocked" and reports drainage from insertion site since placement.

## 2021-03-07 NOTE — ED Notes (Signed)
Pts catheter drainage bag switched to leg bag. Education provided. Pt given new pants, and sandwich and orange juice.

## 2021-03-09 LAB — URINE CULTURE

## 2021-05-03 NOTE — Progress Notes (Signed)
GU Location of Tumor / Histology:  Adenocarcinoma of the prostate  If Prostate Cancer, Gleason Score is (3 + 4), PSA (1.69 as of 02/14/21), and Prostate volume (unknown)  Biopsies revealed:  07/20/2021   Past/Anticipated interventions by urology, if any:  04/25/2021 Dr. Harold Barban (office visit)   10/12/2020 Dr. Rexene Alberts 1.  Cystoscopy 2. Open suprapubic catheter placement  07/20/2020 Dr. Rexene Alberts --Transrectal ultrasound guided prostate biopsy  Past/Anticipated interventions by medical oncology, if any:  No referral placed at this time

## 2021-05-07 ENCOUNTER — Ambulatory Visit
Admission: RE | Admit: 2021-05-07 | Discharge: 2021-05-07 | Disposition: A | Discharge status: Home / Self Care | Payer: Medicare HMO | Source: Ambulatory Visit | Attending: Radiation Oncology | Admitting: Radiation Oncology

## 2021-05-07 ENCOUNTER — Other Ambulatory Visit: Payer: Self-pay

## 2021-05-07 VITALS — BP 114/83 | HR 93 | Temp 97.7°F | Resp 20 | Ht 72.0 in | Wt 274.2 lb

## 2021-05-07 DIAGNOSIS — K219 Gastro-esophageal reflux disease without esophagitis: Secondary | ICD-10-CM | POA: Diagnosis not present

## 2021-05-07 DIAGNOSIS — M069 Rheumatoid arthritis, unspecified: Secondary | ICD-10-CM | POA: Insufficient documentation

## 2021-05-07 DIAGNOSIS — Z79899 Other long term (current) drug therapy: Secondary | ICD-10-CM | POA: Diagnosis not present

## 2021-05-07 DIAGNOSIS — Z8616 Personal history of COVID-19: Secondary | ICD-10-CM | POA: Diagnosis not present

## 2021-05-07 DIAGNOSIS — N401 Enlarged prostate with lower urinary tract symptoms: Secondary | ICD-10-CM | POA: Insufficient documentation

## 2021-05-07 DIAGNOSIS — R338 Other retention of urine: Secondary | ICD-10-CM | POA: Insufficient documentation

## 2021-05-07 DIAGNOSIS — IMO0002 Reserved for concepts with insufficient information to code with codable children: Secondary | ICD-10-CM | POA: Insufficient documentation

## 2021-05-07 DIAGNOSIS — C61 Malignant neoplasm of prostate: Secondary | ICD-10-CM

## 2021-05-07 DIAGNOSIS — I10 Essential (primary) hypertension: Secondary | ICD-10-CM | POA: Diagnosis not present

## 2021-05-07 DIAGNOSIS — Z791 Long term (current) use of non-steroidal anti-inflammatories (NSAID): Secondary | ICD-10-CM | POA: Diagnosis not present

## 2021-05-07 DIAGNOSIS — M542 Cervicalgia: Secondary | ICD-10-CM | POA: Insufficient documentation

## 2021-05-07 DIAGNOSIS — K648 Other hemorrhoids: Secondary | ICD-10-CM | POA: Insufficient documentation

## 2021-05-07 DIAGNOSIS — F609 Personality disorder, unspecified: Secondary | ICD-10-CM | POA: Insufficient documentation

## 2021-05-07 NOTE — Progress Notes (Addendum)
GU Location of Tumor / Histology:  Adenocarcinoma of the prostate  If Prostate Cancer, Gleason Score is (3 + 4), PSA (1.69 as of 02/14/21), and Prostate volume (unknown)  Biopsies revealed:  07/20/2021   Past/Anticipated interventions by urology, if any:  04/25/2021 Dr. Harold Barban (office visit)   10/12/2020 Dr. Rexene Alberts 1.  Cystoscopy 2. Open suprapubic catheter placement  07/20/2020 Dr. Rexene Alberts --Transrectal ultrasound guided prostate biopsy  Past/Anticipated interventions by medical oncology, if any:  No referral placed at this time  Weight changes, if any: weight gain  IPSS Score: 0 SHIM Score:20  Bowel/Bladder complaints, if any: Suprapubic catheter.  Denies any blood.  If becomes too full then pain develops.  Issues with both diarrhea and constipation.     Nausea/Vomiting, if any: no  Pain issues, if any:  generalized pain 5 out of 10 pain scale  SAFETY ISSUES: Prior radiation? no Pacemaker/ICD? no Possible current pregnancy? N/A Is the patient on methotrexate? No but is on hydroxychloroquine 200 mg tablet twice daily.  Current Complaints / other details:   Patient lives at [facility].  They manage all of his medications. Patient states he has a silver necklace on that he is unable to remove as it does not have a clasp.    Vitals:   05/07/21 1042  BP: 114/83  Pulse: 93  Resp: 20  Temp: 97.7 F (36.5 C)  SpO2: 96%  Weight: 274 lb 3.2 oz (124.4 kg)  Height: 6' (1.829 m)

## 2021-05-07 NOTE — Progress Notes (Signed)
Radiation Oncology         ([phone]) [phone] ________________________________  Initial Outpatient Consultation  Name: Jeffrey Key MRN: [id number]  Date: 05/07/2021  DOB: 02/11/1951  [id number], Jori Moll, MD  Remi Haggard, MD   REFERRING PHYSICIAN: Remi Haggard, MD  DIAGNOSIS: 70 y.o. gentleman with Stage T1c adenocarcinoma of the prostate with Gleason score of 3+4, and PSA of 1.69.    ICD-10-CM   1. Malignant neoplasm of prostate ([place])  C61       HISTORY OF PRESENT ILLNESS: Jeffrey Key is a 70 y.o. male with a diagnosis of prostate cancer. He was initially diagnosed with cT1c prostate cancer in 05/2019 in [place], under the care of Dr. Ree Edman, with pathology showing Gleason 3+4 in 1/12 core (5%) and Gleason 3+3 in 4/12 cores (40-90%). PSA at time of diagnosis was 9. He opted for active surveillance at that time. He has struggled with chronic urinary retention managed with indwelling foley catheter. 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  He sought to transfer his care to Dr. Abner Greenspan in late 2021. He underwent repeat transrectal ultrasound with 12 biopsies of the prostate under anesthesia on 07/20/20.  The prostate volume measured 20 cc.  Out of 12 core biopsies, 3 were positive, all on the left.  The maximum Gleason score was 3+4, and this was seen in the left apex, left apex lateral, and left mid lateral. At that time, he declined referral to radiation oncology stating he did not want treatment. 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  His most recent PSA was down to 1.69 on 02/14/21. More recently, he requested a second opinion with Dr. Milford Cage on 04/25/21 for management of the prostate cancer. It was then that he agreed to referral to [place] to discuss radiation options.   The  patient reviewed the biopsy results with his urologist and he has kindly been referred today for discussion of potential radiation treatment options.   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    Depression    GERD (gastroesophageal reflux disease)    History of COVID-19    Hypertension    Rheumatoid arthritis ([place])    Wears dentures       PAST SURGICAL HISTORY: Past Surgical History:  Procedure Laterality Date   CYSTOSCOPY N/A 10/12/2020   Procedure: CYSTOSCOPY WITH SUPRA PUBIC TUBE PLACEMENT;  Surgeon: Janith Lima, MD;  Location: [facility];  Service: Urology;  Laterality: N/A;  ONLY NEEDS 30 MIN NEEDS SAME DAY COVID TEST DUE TO LIVING IN SKILLED NURSING   PROSTATE BIOPSY N/A 07/20/2020   Procedure: BIOPSY TRANSRECTAL ULTRASONIC PROSTATE (TUBP);  Surgeon: Janith Lima, MD;  Location: [facility];  Service: Urology;  Laterality: N/A;  ONLY NEEDS 30 MIN    FAMILY HISTORY: No family history on file.  SOCIAL HISTORY:  Social History   Socioeconomic History   Marital status: Single    Spouse name: Not on file   Number of children: Not on file   Years of education: Not on file   Highest education level: Not on file  Occupational History   Not on file  Tobacco Use   Smoking status: Unknown   Smokeless tobacco: Never  Vaping Use   Vaping Use: Unknown  Substance and Sexual Activity   Alcohol use: Not on file   Drug use: Not on file   Sexual activity: Not on file  Other Topics Concern   Not on file  Social History Narrative   Not on file   Social Determinants of Health   Financial Resource Strain: Not on file  Food Insecurity: Not on file  Transportation Needs: Not on file  Physical Activity: Not on file  Stress: Not on file  Social Connections: Not on file   Intimate Partner Violence: Not on file    ALLERGIES: Quetiapine, Tramadol, Trazodone and nefazodone, and Lisinopril  MEDICATIONS:  Current Outpatient Medications  Medication Sig Dispense Refill   aspirin 81 MG chewable tablet Chew 1 tablet by mouth daily. (Patient not taking: Reported on 05/07/2021)     Cholecalciferol 25 MCG (1000 UT) tablet Take 2 tablets by mouth daily. (Patient not taking: Reported on 05/07/2021)     diclofenac Sodium (VOLTAREN) 1 % GEL APPLY 2 GRAMS TO LEFT ELBOW THREE TIMES DAILY FOR JOINT PAIN OR SWELLING**DON'T EXCEED 16 GRAMS DAILY TO ANY AFFECTED JOINT OF THE LOWER EXTREMITIES. DON'T EXCEED 8 GRAMS DAILY TO ANY AFFECTED JOINT OF THE UPPER EXTREMITIES. DON'T EXCEED A TOTAL DOSE OF 32 GRAMS DAILY OVER ALL JOINTS.     famotidine (PEPCID) 20 MG tablet Take 1 tablet up to 2-times per day for heartburn as needed. (Patient not taking: Reported on 05/07/2021)     finasteride (PROSCAR) 5 MG tablet Take 1 tablet by mouth daily. (Patient not taking: Reported on 05/07/2021)     gabapentin (NEURONTIN) 300 MG capsule Take by mouth.     losartan (COZAAR) 25 MG tablet Take 1 tablet by mouth daily. (Patient not taking: Reported on 05/07/2021)     metoprolol succinate (TOPROL-XL) 50 MG 24 hr tablet Take 1 tablet by mouth daily. (Patient not taking: Reported on 05/07/2021)     nortriptyline (PAMELOR) 75 MG capsule Take 1 capsule by mouth at bedtime. (Patient not taking: Reported on 05/07/2021)     omeprazole (PRILOSEC) 20 MG capsule Take 1 capsule by mouth daily.     predniSONE (DELTASONE) 20 MG tablet Take 3 tabs qd x 3 days, then 2 tabs qd x 3 days, then 1 tab qd x 3 days, then 1/2 tab qd x 4 days (Patient not taking: Reported on 05/07/2021)     tamsulosin (FLOMAX) 0.4 MG CAPS capsule Take 1 capsule by mouth daily. (Patient not taking: Reported on 05/07/2021)     tamsulosin (FLOMAX) 0.4 MG CAPS capsule Take by mouth. (Patient not taking: Reported on 05/07/2021)     tiZANidine (ZANAFLEX) 4 MG tablet Take  by mouth. (Patient not taking: Reported on 05/07/2021)     trimethoprim (TRIMPEX) 100 MG tablet Take by mouth. (Patient not taking: Reported on 05/07/2021)     aspirin EC 81 MG tablet Take 81 mg by mouth daily. Swallow whole.     buprenorphine (BUTRANS) 5 MCG/HR [facility] Place onto the skin once a week.     cefdinir (OMNICEF) 300 MG capsule Take by mouth. (Patient not taking: Reported on 05/07/2021)     CHLORASEPTIC 1.4 % LIQD SMARTSIG:By Mouth     cholecalciferol (VITAMIN D3) 25 MCG (1000 UNIT) tablet Take 2,000 Units by mouth daily.     ciprofloxacin (CIPRO) 500 MG tablet Take 1  tablet (500 mg total) by mouth 2 (two) times daily. (Patient not taking: Reported on 05/07/2021) 14 tablet 0   diclofenac Sodium (VOLTAREN) 1 % GEL Apply 2 g topically every 8 (eight) hours as needed (pain in left elbow).     finasteride (PROSCAR) 5 MG tablet Take 5 mg by mouth daily. (Patient not taking: Reported on 05/07/2021)     gabapentin (NEURONTIN) 400 MG capsule Take 400 mg by mouth 3 (three) times daily. (Patient not taking: Reported on 05/07/2021)     hydroxychloroquine (PLAQUENIL) 200 MG tablet Take 200 mg by mouth 2 (two) times daily.     methocarbamol (ROBAXIN) 500 MG tablet Take 1 tablet (500 mg total) by mouth every 8 (eight) hours as needed for muscle spasms. 30 tablet 0   metoprolol succinate (TOPROL-XL) 50 MG 24 hr tablet Take 50 mg by mouth daily. Take with or immediately following a meal.     MODERNA COVID-19 VACCINE 100 MCG/0.5ML injection  (Patient not taking: Reported on 05/07/2021)     Multiple Vitamin (MULTI-VITAMIN) tablet Take 1 tablet by mouth daily. (Patient not taking: Reported on 05/07/2021)     nortriptyline (PAMELOR) 50 MG capsule Take by mouth.     nortriptyline (PAMELOR) 75 MG capsule Take 75 mg by mouth at bedtime. (Patient not taking: Reported on 05/07/2021)     nystatin ointment (MYCOSTATIN) SMARTSIG:1 Topical Daily PRN (Patient not taking: Reported on 05/07/2021)     omeprazole (PRILOSEC) 20 MG capsule  Take 20 mg by mouth daily.     phenazopyridine (PYRIDIUM) 100 MG tablet Take by mouth. (Patient not taking: Reported on 05/07/2021)     phenazopyridine (PYRIDIUM) 200 MG tablet Take 1 tablet (200 mg total) by mouth 3 (three) times daily. (Patient not taking: Reported on 05/07/2021) 6 tablet 0   polyethylene glycol (MIRALAX / GLYCOLAX) 17 g packet Take 17 g by mouth daily as needed.     Propylene Glycol (SYSTANE COMPLETE OP) Apply 1 drop to eye in the morning and at bedtime. Both eyes for dry eye     sulfamethoxazole-trimethoprim (BACTRIM DS) 800-160 MG tablet Take 1 tablet by mouth 2 (two) times daily. (Patient not taking: Reported on 05/07/2021)     tamsulosin (FLOMAX) 0.4 MG CAPS capsule Take 0.4 mg by mouth daily after supper.     triamcinolone cream (KENALOG) 0.5 % Apply 1 application topically 3 (three) times daily as needed (apply to itching areas).     No current facility-administered medications for this encounter.    REVIEW OF SYSTEMS:  On review of systems, the patient reports that he is doing well overall. He denies any chest pain, shortness of breath, cough, fevers, chills, night sweats, unintended weight changes. He denies any bowel disturbances, and denies abdominal pain, nausea or vomiting. He denies any new musculoskeletal or joint aches or pains. He has an indwelling SP tube but does occasionally get the sensation that he needs to void if the bag is too full. He is not sexually active. A complete review of systems is obtained and is otherwise negative.    PHYSICAL EXAM:  Wt Readings from Last 3 Encounters:  05/07/21 274 lb 3.2 oz (124.4 kg)  05/07/21 274 lb 3.2 oz (124.4 kg)  01/07/21 235 lb (106.6 kg)   Temp Readings from Last 3 Encounters:  05/07/21 97.7 F (36.5 C)  05/07/21 97.7 F (36.5 C)  03/08/21 98.7 F (37.1 C) (Oral)   BP Readings from Last 3 Encounters:  05/07/21 114/83  05/07/21 114/83  03/08/21 (!) 150/88   Pulse Readings from Last 3 Encounters:  05/07/21  93  05/07/21 93  03/08/21 80    /10  In general this is a well appearing Caucasian male sitting comfortably in his wheelchair in no acute distress. He's alert and oriented x4 and appropriate throughout the examination. Cardiopulmonary assessment is negative for acute distress, and he exhibits normal effort.     KPS = 70 (back issues)  100 - Normal; no complaints; no evidence of disease. 90   - Able to carry on normal activity; minor signs or symptoms of disease. 80   - Normal activity with effort; some signs or symptoms of disease. 71   - Cares for self; unable to carry on normal activity or to do active work. 60   - Requires occasional assistance, but is able to care for most of his personal needs. 50   - Requires considerable assistance and frequent medical care. 36   - Disabled; requires special care and assistance. 48   - Severely disabled; hospital admission is indicated although death not imminent. 51   - Very sick; hospital admission necessary; active supportive treatment necessary. 10   - Moribund; fatal processes progressing rapidly. 0     - Dead  Karnofsky DA, Abelmann [place], Craver LS and Burchenal [facility] [phone]) The use of the nitrogen mustards in the palliative treatment of carcinoma: with particular reference to bronchogenic carcinoma Cancer 1 634-56  LABORATORY DATA:  Lab Results  Component Value Date   WBC 5.0 10/12/2020   HGB 13.0 10/12/2020   HCT 41.6 10/12/2020   MCV 88.7 10/12/2020   PLT 179 10/12/2020   Lab Results  Component Value Date   NA 140 10/12/2020   K 4.0 10/12/2020   CL 105 10/12/2020   CO2 27 10/12/2020   No results found for: ALT, AST, GGT, ALKPHOS, BILITOT   RADIOGRAPHY: No results found.    IMPRESSION/PLAN: 1. 70 y.o. gentleman with Stage T1c adenocarcinoma of the prostate with Gleason Score of 3+4, and PSA of 1.69. We discussed the patient's workup and outlined the nature of prostate cancer in this setting. The patient's T stage, Gleason's  score, and PSA put him into the favorable intermediate risk group. Accordingly, he is eligible for a variety of potential treatment options including brachytherapy, 5.5 weeks of external radiation, or prostatectomy. We discussed the available radiation techniques, and focused on the details and logistics of delivery. He is not a candidate for brachytherapy with a prostate volume of 19cc. We discussed and outlined the risks, benefits, short and long-term effects associated with radiotherapy and compared and contrasted these with prostatectomy. We discussed the role of SpaceOAR gel in reducing the rectal toxicity associated with radiotherapy.  He was encouraged to ask questions that were answered to his stated satisfaction.  At the conclusion of our conversation, the patient is interested in moving forward with 5.5 weeks of external beam therapy. We will share our discussion with Dr. Abner Greenspan and make arrangements for fiducial markers and SpaceOAR gel placement, prior to simulation, to reduce rectal toxicity from radiotherapy. The patient appears to have a good understanding of his disease and our treatment recommendations which are of curative intent and is in agreement with the stated plan.  Therefore, we will move forward with treatment planning accordingly, in anticipation of beginning IMRT in the near future.  We personally spent 75 minutes in this encounter including chart review, reviewing radiological studies, meeting face-to-face with the patient, entering orders and completing  documentation.    Nicholos Johns, PA-C    Tyler Pita, MD  [facility] Direct Dial: [phone]  Fax: [phone] [email or web address]  Skype  [email or web address]   This document serves as a record of services personally performed by Tyler Pita, MD and Freeman Caldron, PA-C. It was created on their behalf by Wilburn Mylar, a trained medical scribe. The creation of this record is based on the scribe's  personal observations and the provider's statements to them. This document has been checked and approved by the attending provider.

## 2021-05-08 ENCOUNTER — Encounter: Payer: Self-pay | Admitting: Licensed Clinical Social Worker

## 2021-05-08 NOTE — Progress Notes (Signed)
[place] Psychosocial Distress Screening Clinical Social Work  Social Work was referred by distress screening protocol.  The patient scored a 5 on the Psychosocial Distress Thermometer which indicates moderate distress. Social Worker  attempted to contact pt by phone  to assess for distress and other psychosocial needs. Cell phone went to busy signal, home number is front desk at assisted living facility. Left message on his floor.  ONCBCN DISTRESS SCREENING 05/07/2021  Screening Type Initial Screening  Distress experienced in past week (1-10) 5  Practical problem type Housing;Transportation  Emotional problem type Isolation/feeling alone;Boredom  Information Concerns Type Lack of info about complementary therapy choices  Physical Problem type Pain;Sleep/insomnia;Getting around;Constipation/diarrhea;Changes in urination;Tingling hands/feet  Physician notified of physical symptoms Yes  Referral to clinical social work Yes    Social Worker follow up needed: re-consult social work as needed when pt is in person.   Bari Edward, Social Work Intern Mizuki Hoel Florene Glen, LCSW

## 2021-05-15 ENCOUNTER — Telehealth: Payer: Self-pay | Admitting: *Deleted

## 2021-05-15 NOTE — Telephone Encounter (Signed)
Called patient to inform of fid. marker and space oar placement on 06-27-21 @ [facility] and his sim on Nov. 1 - arrival time- 10:45 am @ [place] [facility] for a return call

## 2021-06-12 ENCOUNTER — Encounter: Payer: Self-pay | Admitting: Podiatry

## 2021-06-12 ENCOUNTER — Other Ambulatory Visit: Payer: Self-pay

## 2021-06-12 ENCOUNTER — Ambulatory Visit (INDEPENDENT_AMBULATORY_CARE_PROVIDER_SITE_OTHER): Payer: Medicare HMO | Admitting: Podiatry

## 2021-06-12 DIAGNOSIS — B353 Tinea pedis: Secondary | ICD-10-CM

## 2021-06-12 DIAGNOSIS — B351 Tinea unguium: Secondary | ICD-10-CM

## 2021-06-12 DIAGNOSIS — M19279 Secondary osteoarthritis, unspecified ankle and foot: Secondary | ICD-10-CM

## 2021-06-12 DIAGNOSIS — M1990 Unspecified osteoarthritis, unspecified site: Secondary | ICD-10-CM

## 2021-06-12 MED ORDER — KETOCONAZOLE 2 % EX CREA: 1.0000 "application " | TOPICAL_CREAM | Freq: Every day | CUTANEOUS | 2 refills | Status: AC

## 2021-06-12 NOTE — Progress Notes (Signed)
  Subjective:  Patient ID: Jeffrey Key, male    DOB: 04-14-1951,   MRN: [id number]  Chief Complaint  Patient presents with   Nail Problem    Patient needs nail trim and is concerned about his dry flaky skin as well.     70 y.o. male presents for concern of thickened elongated nails he cannot trim himself. Relates he also has dry peeling skin on the bottom of his feet that itches occasionally. Also relates pain in his left ankle from Rheumatoid arthritis and hoping to get an ankle brace.  . Denies any other pedal complaints. Denies n/v/f/c.   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 CFT <3 seconds. Normal hair growth on digits. No edema.  Skin. No lacerations or abrasions bilateral feet. Nails 1-5 are thickened discolored and elongated with subungual debris. Scaling noted to plantar feet bilateral.  Musculoskeletal: MMT 5/5 bilateral lower extremities in DF, PF, Inversion and Eversion. Deceased ROM in DF of ankle joint. Tender to palpation over anterior joint line and with ROM of ankle.  Neurological: Sensation intact to light touch.   Assessment:   1. Tinea pedis of both feet   2. Onychomycosis   3. 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 -Discussed supportive shoes at all times and checking feet regularly.  -Mechanically debrided all nails 1-5 bilateral using sterile nail nipper and filed with dremel without incident  -Prescription for ketoconazole for tinea pedis.  -Trilock provided for left ankle arthritis.  -Answered all patient  questions -Patient to return  as needed.  -Patient advised to call the office if any problems or questions arise in the meantime.   [name], DPM

## 2021-07-02 ENCOUNTER — Ambulatory Visit: Payer: Medicare HMO | Admitting: Radiation Oncology

## 2021-07-11 ENCOUNTER — Telehealth: Payer: Self-pay | Admitting: *Deleted

## 2021-07-11 NOTE — Telephone Encounter (Signed)
Called patient to inform  that his sim has been cancelled for 07-12-21 due to not having his fid. markers and space oar placed, he will be rescheduled once his fid. markers and space oar haved been rescheduled informed the facility where he resides, the facility verified understanding this

## 2021-07-12 ENCOUNTER — Ambulatory Visit: Payer: Medicare HMO | Admitting: Radiation Oncology

## 2021-09-12 ENCOUNTER — Emergency Department (HOSPITAL_COMMUNITY)
Admission: EM | Admit: 2021-09-12 | Discharge: 2021-09-13 | Disposition: A | Discharge status: Home / Self Care | Payer: Medicare HMO | Attending: Emergency Medicine | Admitting: Emergency Medicine

## 2021-09-12 ENCOUNTER — Emergency Department (HOSPITAL_COMMUNITY): Payer: Medicare HMO

## 2021-09-12 ENCOUNTER — Other Ambulatory Visit: Payer: Self-pay

## 2021-09-12 ENCOUNTER — Encounter (HOSPITAL_COMMUNITY): Payer: Self-pay | Admitting: Emergency Medicine

## 2021-09-12 DIAGNOSIS — Z20822 Contact with and (suspected) exposure to covid-19: Secondary | ICD-10-CM | POA: Insufficient documentation

## 2021-09-12 DIAGNOSIS — F32A Depression, unspecified: Secondary | ICD-10-CM | POA: Diagnosis not present

## 2021-09-12 DIAGNOSIS — R5383 Other fatigue: Secondary | ICD-10-CM | POA: Diagnosis not present

## 2021-09-12 DIAGNOSIS — R0602 Shortness of breath: Secondary | ICD-10-CM | POA: Insufficient documentation

## 2021-09-12 DIAGNOSIS — I10 Essential (primary) hypertension: Secondary | ICD-10-CM | POA: Insufficient documentation

## 2021-09-12 DIAGNOSIS — Z7982 Long term (current) use of aspirin: Secondary | ICD-10-CM | POA: Insufficient documentation

## 2021-09-12 DIAGNOSIS — F4321 Adjustment disorder with depressed mood: Secondary | ICD-10-CM | POA: Insufficient documentation

## 2021-09-12 DIAGNOSIS — Z5900 Homelessness unspecified: Secondary | ICD-10-CM | POA: Insufficient documentation

## 2021-09-12 DIAGNOSIS — R45851 Suicidal ideations: Secondary | ICD-10-CM | POA: Insufficient documentation

## 2021-09-12 DIAGNOSIS — Z79899 Other long term (current) drug therapy: Secondary | ICD-10-CM | POA: Diagnosis not present

## 2021-09-12 DIAGNOSIS — R000 Tachycardia, unspecified: Secondary | ICD-10-CM | POA: Diagnosis not present

## 2021-09-12 LAB — CBC WITH DIFFERENTIAL/PLATELET
Abs Immature Granulocytes: 0.02 10*3/uL (ref 0.00–0.07)
Basophils Absolute: 0 10*3/uL (ref 0.0–0.1)
Basophils Relative: 0 %
Eosinophils Absolute: 0 10*3/uL (ref 0.0–0.5)
Eosinophils Relative: 0 %
HCT: 46.1 % (ref 39.0–52.0)
Hemoglobin: 14.9 g/dL (ref 13.0–17.0)
Immature Granulocytes: 0 %
Lymphocytes Relative: 9 %
Lymphs Abs: 0.8 10*3/uL (ref 0.7–4.0)
MCH: 28.3 pg (ref 26.0–34.0)
MCHC: 32.3 g/dL (ref 30.0–36.0)
MCV: 87.6 fL (ref 80.0–100.0)
Monocytes Absolute: 0.4 10*3/uL (ref 0.1–1.0)
Monocytes Relative: 5 %
Neutro Abs: 7 10*3/uL (ref 1.7–7.7)
Neutrophils Relative %: 86 %
Platelets: 195 10*3/uL (ref 150–400)
RBC: 5.26 MIL/uL (ref 4.22–5.81)
RDW: 12.6 % (ref 11.5–15.5)
WBC: 8.3 10*3/uL (ref 4.0–10.5)
nRBC: 0 % (ref 0.0–0.2)

## 2021-09-12 LAB — BASIC METABOLIC PANEL
Anion gap: 13 (ref 5–15)
BUN: 18 mg/dL (ref 8–23)
CO2: 23 mmol/L (ref 22–32)
Calcium: 9.3 mg/dL (ref 8.9–10.3)
Chloride: 100 mmol/L (ref 98–111)
Creatinine, Ser: 1.47 mg/dL — ABNORMAL HIGH (ref 0.61–1.24)
GFR, Estimated: 51 mL/min — ABNORMAL LOW (ref >60)
Glucose, Bld: 131 mg/dL — ABNORMAL HIGH (ref 70–99)
Potassium: 4.3 mmol/L (ref 3.5–5.1)
Sodium: 136 mmol/L (ref 135–145)

## 2021-09-12 LAB — RESP PANEL BY RT-PCR (FLU A&B, COVID) ARPGX2
Influenza A by PCR: NEGATIVE
Influenza B by PCR: NEGATIVE
SARS Coronavirus 2 by RT PCR: NEGATIVE

## 2021-09-12 LAB — TROPONIN I (HIGH SENSITIVITY): Troponin I (High Sensitivity): 5 ng/L (ref <18)

## 2021-09-12 MED ORDER — HYDROCODONE-ACETAMINOPHEN 5-325 MG PO TABS
1.0000 | ORAL_TABLET | ORAL | Status: DC | PRN
  Administered 2021-09-12: 1 via ORAL
  Filled 2021-09-12: qty 1

## 2021-09-12 NOTE — Care Management (Signed)
ED RNCM contacted [facility] to obtain a recent med list to reconcile. Nurse from [facility] attempted to fax recent med list but it was never received.  Will leave update for daytime TOC team to follow up.

## 2021-09-12 NOTE — ED Triage Notes (Signed)
Pt here from [facility] with c/o pain from his arthritis and wanting help with meds , also would like help from SW with assisted living placement

## 2021-09-12 NOTE — ED Notes (Signed)
Pt moved to [place].
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
## 2021-09-12 NOTE — ED Notes (Signed)
Patient to room 28. Patient oriented to unit and room. Patient cooperative, no s/s of distress

## 2021-09-12 NOTE — ED Triage Notes (Signed)
Pt just left [facility] yesterday

## 2021-09-12 NOTE — ED Provider Notes (Signed)
[place] DEPT Provider Note   CSN: [id number] Arrival date & time: 09/12/21  1049     History  Chief Complaint  Patient presents with   Homeless    Jeffrey Key is a 71 y.o. male.  With past medical history of hypertension, GERD, chronic pain, homelessness who presents to the emergency department for an assistance and assisted living placement.  When I went to evaluate the patient the patient is slumped over in his wheelchair.  He states that he "just feels so weak and worn out."  He states that I recently left [facility] facility and went to [facility].  I was unable to stay at [facility] because he cannot stand up to shower.  I cannot be homeless and I need placement."  He goes on to say that he does feel short of breath.  He denies any other symptoms including fever, chest pain, palpitations, cough or congestion.Marland Kitchen  HPI     Home Medications Prior to Admission medications   Medication Sig Start Date End Date Taking? Authorizing Provider  aspirin 81 MG chewable tablet Chew 1 tablet by mouth daily. Patient not taking: Reported on 05/07/2021 01/16/20   [provider]  aspirin EC 81 MG tablet Take 81 mg by mouth daily. Swallow whole.    [provider]  buprenorphine (BUTRANS) 5 MCG/HR [place] onto the skin once a week.    [provider]  cefdinir (OMNICEF) 300 MG capsule Take by mouth. Patient not taking: Reported on 05/07/2021 11/20/20   [provider]  CHLORASEPTIC 1.4 % LIQD SMARTSIG:By Mouth 01/18/21   [provider]  cholecalciferol (VITAMIN D3) 25 MCG (1000 UNIT) tablet Take 2,000 Units by mouth daily.    [provider]  Cholecalciferol 25 MCG (1000 UT) tablet Take 2 tablets by mouth daily. Patient not taking: Reported on 05/07/2021 09/14/17   [provider]  ciprofloxacin (CIPRO) 500 MG tablet Take 1 tablet (500 mg total) by mouth 2 (two) times daily. Patient not taking: Reported on  05/07/2021 03/07/21   Isla Pence, MD  diclofenac Sodium (VOLTAREN) 1 % GEL Apply 2 g topically every 8 (eight) hours as needed (pain in left elbow).    [provider]  diclofenac Sodium (VOLTAREN) 1 % GEL APPLY 2 GRAMS TO LEFT ELBOW THREE TIMES DAILY FOR JOINT PAIN OR SWELLING**DON'T EXCEED 16 GRAMS DAILY TO ANY AFFECTED JOINT OF THE LOWER EXTREMITIES. DON'T EXCEED 8 GRAMS DAILY TO ANY AFFECTED JOINT OF THE UPPER EXTREMITIES. DON'T EXCEED A TOTAL DOSE OF 32 GRAMS DAILY OVER ALL JOINTS. 06/23/18   [provider]  famotidine (PEPCID) 20 MG tablet Take 1 tablet up to 2-times per day for heartburn as needed. Patient not taking: Reported on 05/07/2021 11/15/19   [provider]  finasteride (PROSCAR) 5 MG tablet Take 5 mg by mouth daily. Patient not taking: Reported on 05/07/2021    [provider]  finasteride (PROSCAR) 5 MG tablet Take 1 tablet by mouth daily. Patient not taking: Reported on 05/07/2021 01/16/20   [provider]  gabapentin (NEURONTIN) 300 MG capsule Take by mouth. 02/02/20   [provider]  gabapentin (NEURONTIN) 400 MG capsule Take 400 mg by mouth 3 (three) times daily. Patient not taking: Reported on 05/07/2021    [provider]  hydroxychloroquine (PLAQUENIL) 200 MG tablet Take 200 mg by mouth 2 (two) times daily.    [provider]  ketoconazole (NIZORAL) 2 % cream Apply 1 application topically daily.  06/12/21   [name], MD  losartan (COZAAR) 25 MG tablet Take 1 tablet by mouth daily. Patient not taking: Reported on 05/07/2021 06/23/18   [provider]  methocarbamol (ROBAXIN) 500 MG tablet Take 1 tablet (500 mg total) by mouth every 8 (eight) hours as needed for muscle spasms. 01/07/21   [name], MD  metoprolol succinate (TOPROL-XL) 50 MG 24 hr tablet Take 50 mg by mouth daily. Take with or immediately following a meal.    [provider]  metoprolol succinate (TOPROL-XL) 50 MG 24 hr  tablet Take 1 tablet by mouth daily. Patient not taking: Reported on 05/07/2021 06/23/18   [provider]  MODERNA COVID-19 VACCINE 100 MCG/0.5ML injection  12/19/20   [provider]  Multiple Vitamin (MULTI-VITAMIN) tablet Take 1 tablet by mouth daily. Patient not taking: Reported on 05/07/2021    [provider]  nortriptyline (PAMELOR) 50 MG capsule Take by mouth. 02/13/21   [provider]  nortriptyline (PAMELOR) 75 MG capsule Take 75 mg by mouth at bedtime. Patient not taking: Reported on 05/07/2021    [provider]  nortriptyline (PAMELOR) 75 MG capsule Take 1 capsule by mouth at bedtime. Patient not taking: Reported on 05/07/2021 01/16/20   [provider]  nystatin ointment (MYCOSTATIN) SMARTSIG:1 Topical Daily PRN Patient not taking: Reported on 05/07/2021 11/20/20   [provider]  omeprazole (PRILOSEC) 20 MG capsule Take 20 mg by mouth daily.    [provider]  omeprazole (PRILOSEC) 20 MG capsule Take 1 capsule by mouth daily. 01/16/20   [provider]  phenazopyridine (PYRIDIUM) 100 MG tablet Take by mouth. Patient not taking: Reported on 05/07/2021 11/16/20   [provider]  phenazopyridine (PYRIDIUM) 200 MG tablet Take 1 tablet (200 mg total) by mouth 3 (three) times daily. Patient not taking: Reported on 05/07/2021 03/07/21   Isla Pence, MD  polyethylene glycol (MIRALAX / GLYCOLAX) 17 g packet Take 17 g by mouth daily as needed.    [provider]  predniSONE (DELTASONE) 20 MG tablet Take 3 tabs qd x 3 days, then 2 tabs qd x 3 days, then 1 tab qd x 3 days, then 1/2 tab qd x 4 days Patient not taking: Reported on 05/07/2021 01/16/20   [provider]  Propylene Glycol (SYSTANE COMPLETE OP) Apply 1 drop to eye in the morning and at bedtime. Both eyes for dry eye    [provider]  sulfamethoxazole-trimethoprim (BACTRIM DS) 800-160 MG tablet Take 1 tablet by mouth 2 (two) times  daily. Patient not taking: Reported on 05/07/2021 11/16/20   [provider]  tamsulosin (FLOMAX) 0.4 MG CAPS capsule Take 0.4 mg by mouth daily after supper.    [provider]  tamsulosin (FLOMAX) 0.4 MG CAPS capsule Take 1 capsule by mouth daily. Patient not taking: Reported on 05/07/2021 01/16/20   [provider]  tamsulosin (FLOMAX) 0.4 MG CAPS capsule Take by mouth. Patient not taking: Reported on 05/07/2021 05/24/19   [provider]  tiZANidine (ZANAFLEX) 4 MG tablet Take by mouth. Patient not taking: Reported on 05/07/2021 05/10/19   [provider]  triamcinolone cream (KENALOG) 0.5 % Apply 1 application topically 3 (three) times daily as needed (apply to itching areas).    [provider]  trimethoprim (TRIMPEX) 100 MG tablet Take by mouth. Patient not taking: Reported on 05/07/2021 08/10/19   [provider]      Allergies    Quetiapine, Tramadol, Trazodone and  nefazodone, and Lisinopril    Review of Systems   Review of Systems  Constitutional:  Negative for fever.  Respiratory:  Positive for shortness of breath. Negative for cough.   Cardiovascular:  Negative for chest pain, palpitations and leg swelling.  All other systems reviewed and are negative.  Physical Exam Updated Vital Signs BP (!) 149/97 (BP Location: Left Arm)    Pulse 96    Temp 97.7 F (36.5 C)    Resp 18    SpO2 95%  Physical Exam Vitals and nursing note reviewed.  Constitutional:      General: He is not in acute distress.    Appearance: He is obese. He is not toxic-appearing.     Comments: Disheveled and chronically ill-appearing gentleman  HENT:     Head: Normocephalic and atraumatic.  Eyes:     General: No scleral icterus. Cardiovascular:     Rate and Rhythm: Tachycardia present.     Pulses: Normal pulses.     Heart sounds: No murmur heard. Pulmonary:     Effort: Pulmonary effort is normal. No respiratory distress.     Breath sounds: Normal  breath sounds.  Abdominal:     Palpations: Abdomen is soft.  Musculoskeletal:        General: Tenderness present.     Cervical back: Normal range of motion.     Comments: Chronic musculoskeletal tenderness and chronic low back pain  Skin:    General: Skin is warm and dry.     Capillary Refill: Capillary refill takes less than 2 seconds.     Findings: No rash.  Neurological:     General: No focal deficit present.     Mental Status: He is alert and oriented to person, place, and time. Mental status is at baseline.  Psychiatric:        Mood and Affect: Mood normal.        Behavior: Behavior normal.        Thought Content: Thought content normal.        Judgment: Judgment normal.    ED Results / Procedures / Treatments   Labs (all labs ordered are listed, but only abnormal results are displayed) Labs Reviewed  BASIC METABOLIC PANEL - Abnormal; Notable for the following components:      Result Value   Glucose, Bld 131 (*)    Creatinine, Ser 1.47 (*)    GFR, Estimated 51 (*)    All other components within normal limits  RESP PANEL BY RT-PCR (FLU A&B, COVID) ARPGX2  CBC WITH DIFFERENTIAL/PLATELET  TROPONIN I (HIGH SENSITIVITY)  TROPONIN I (HIGH SENSITIVITY)    EKG None  Radiology DG Chest 2 View  Result Date: 09/12/2021 CLINICAL DATA:  Shortness of breath EXAM: CHEST - 2 VIEW COMPARISON:  None. FINDINGS: Cardiac silhouette and mediastinal contours are within normal limits. The lungs are clear. No pleural effusion or pneumothorax. Mild multilevel degenerative disc changes of the thoracic spine. IMPRESSION: No active cardiopulmonary disease. Electronically Signed   By: Yvonne Kendall   On: 09/12/2021 12:34    Procedures Procedures    Medications Ordered in ED Medications - No data to display  ED Course/ Medical Decision Making/ A&P                           Medical Decision Making 71 year old male who presents to emergency department with complaints that he is without  housing and needs help with medication and social work  help with placement to assisted living facility. While he is here he complains of shortness of breath and fatigue.  We obtained chest x-ray which was negative for pneumonia, pneumothorax, pleural effusion.  Also obtain EKG which shows normal sinus rhythm without ischemia or infarction.  Troponin negative.  Basic lab work negative.  He is negative for COVID and influenza.  While the patient is here he becomes very irate asking to speak with social work.  Social work consult was placed and Lu Duffel has come to speak with the patient.  She has discussed with him that the ER does not help with placement to SNF.  Additionally Education officer, museum discusses with me that the patient left [facility] AMA.  He did go to [facility] but was unable to stay and presented here.  While social work was attempting to find placement for patient as well as discuss care with [facility], the patient is irate with nursing staff stating that if he is discharged he will step out in front of traffic and kill himself.  TTS order placed, however concerns that patient is malingering for secondary gain.  We discussed case with social worker who states that the patient will need to be boarded overnight while they continue to talk with supplementals as well as look for patient placement elsewhere and reconcile medications.  1509: Care of patient handed off to [facility], PA-C at this time. Pending placement recommendations from SW and TTS consult for SI. Patient is medically cleared.  Final Clinical Impression(s) / ED Diagnoses Final diagnoses:  None    Rx / DC Orders ED Discharge Orders     None         Mickie Hillier, PA-C 09/12/21 1510    Valarie Merino, MD 09/15/21 1432

## 2021-09-12 NOTE — [place] FL2 (Signed)
[place] LEVEL OF CARE SCREENING TOOL     IDENTIFICATION  Patient Name: Jeffrey Key Birthdate: 04-24-1951 Sex: male Admission Date (Current Location): 09/12/2021  [facility] and [place] Number:  Herbalist and Address:  [facility],  [place] [address], [place]      Provider Number: [id number]  Attending Physician Name and Address:  Default, Provider, MD  Relative Name and Phone Number:       Current Level of Care: Hospital Recommended Level of Care: [place] Prior Approval Number:    Date Approved/Denied:   PASRR Number:    Discharge Plan: Other (Comment) (Assisted living facility)    Current Diagnoses: Patient Active Problem List   Diagnosis Date Noted   Internal hemorrhoids 05/07/2021   Degeneration of intervertebral disc 05/07/2021   Cervicalgia 05/07/2021   Personality disorder ([place]) 05/07/2021   Insomnia 01/16/2020   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/2020   Other and unspecified hyperlipidemia 01/15/2020   Recurrent major depressive disorder ([place]) 01/15/2020   Encounter for removal of ureteral stent 06/23/2019   Cystitis 06/09/2019   Chronic midline low back pain with sciatica 06/09/2019   Malignant neoplasm of prostate ([place]) 06/06/2019   Severe obesity (BMI 35.0-39.9) with comorbidity ([place]) 05/16/2019   Urinary incontinence 02/18/2019   Bilateral hydronephrosis 02/17/2019   Elevated PSA, less than 10 ng/ml 01/18/2019   TB lung, 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   Non compliance with medical treatment 07/30/2018   IFG (impaired fasting glucose) 07/12/2018   Essential hypertension 07/11/2018   Gastroesophageal reflux disease without esophagitis 07/11/2018   BPH (benign prostatic hyperplasia) 07/11/2018   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   Depression 07/04/2018   Homelessness 07/04/2018   Chronic pain syndrome 07/04/2018    Orientation  RESPIRATION BLADDER Height & Weight     Place, Situation, Time, Self  Normal Continent, External catheter Weight:   Height:     BEHAVIORAL SYMPTOMS/MOOD NEUROLOGICAL BOWEL NUTRITION STATUS      Continent Diet (Normal)  AMBULATORY STATUS COMMUNICATION OF NEEDS Skin   Extensive Assist (Wheelchair bound) Verbally Normal                       Personal Care Assistance Level of Assistance  Bathing, Feeding, Dressing Bathing Assistance: Limited assistance Feeding assistance: Independent Dressing Assistance: Limited assistance     Functional Limitations Info  Hearing, Speech, Sight Sight Info: Adequate Hearing Info: Adequate Speech Info: Adequate    SPECIAL CARE FACTORS FREQUENCY                       Contractures Contractures Info: Not present    Additional Factors Info  Code Status, Allergies Code Status Info: Full Code Allergies Info: Lisinopril, Trazadone, Quetiapine, Nefazodone           Current Medications (09/12/2021):  This is the current hospital active medication list No current facility-administered medications for this encounter.   Current Outpatient Medications  Medication Sig Dispense Refill   aspirin 81 MG chewable tablet Chew 1 tablet by mouth daily. (Patient not taking: Reported on 05/07/2021)     aspirin EC 81 MG tablet Take 81 mg by mouth daily. Swallow whole.     buprenorphine (BUTRANS) 5 MCG/HR [facility] Place onto the skin once a week.     CHLORASEPTIC 1.4 % LIQD SMARTSIG:By Mouth     cholecalciferol (VITAMIN D3) 25 MCG (1000 UNIT) tablet Take  2,000 Units by mouth daily.     Cholecalciferol 25 MCG (1000 UT) tablet Take 2 tablets by mouth daily. (Patient not taking: Reported on 05/07/2021)     diclofenac Sodium (VOLTAREN) 1 % GEL Apply 2 g topically every 8 (eight) hours as needed (pain in left elbow).     diclofenac Sodium (VOLTAREN) 1 % GEL APPLY 2 GRAMS TO LEFT ELBOW THREE TIMES DAILY FOR JOINT PAIN OR SWELLING**DON'T EXCEED 16 GRAMS DAILY TO ANY  AFFECTED JOINT OF THE LOWER EXTREMITIES. DON'T EXCEED 8 GRAMS DAILY TO ANY AFFECTED JOINT OF THE UPPER EXTREMITIES. DON'T EXCEED A TOTAL DOSE OF 32 GRAMS DAILY OVER ALL JOINTS.     famotidine (PEPCID) 20 MG tablet Take 1 tablet up to 2-times per day for heartburn as needed. (Patient not taking: Reported on 05/07/2021)     finasteride (PROSCAR) 5 MG tablet Take 5 mg by mouth daily. (Patient not taking: Reported on 05/07/2021)     finasteride (PROSCAR) 5 MG tablet Take 1 tablet by mouth daily. (Patient not taking: Reported on 05/07/2021)     gabapentin (NEURONTIN) 300 MG capsule Take by mouth.     gabapentin (NEURONTIN) 400 MG capsule Take 400 mg by mouth 3 (three) times daily. (Patient not taking: Reported on 05/07/2021)     hydroxychloroquine (PLAQUENIL) 200 MG tablet Take 200 mg by mouth 2 (two) times daily.     ketoconazole (NIZORAL) 2 % cream Apply 1 application topically daily. 60 g 2   losartan (COZAAR) 25 MG tablet Take 1 tablet by mouth daily. (Patient not taking: Reported on 05/07/2021)     methocarbamol (ROBAXIN) 500 MG tablet Take 1 tablet (500 mg total) by mouth every 8 (eight) hours as needed for muscle spasms. 30 tablet 0   metoprolol succinate (TOPROL-XL) 50 MG 24 hr tablet Take 50 mg by mouth daily. Take with or immediately following a meal.     metoprolol succinate (TOPROL-XL) 50 MG 24 hr tablet Take 1 tablet by mouth daily. (Patient not taking: Reported on 05/07/2021)     MODERNA COVID-19 VACCINE 100 MCG/0.5ML injection  (Patient not taking: Reported on 05/07/2021)     Multiple Vitamin (MULTI-VITAMIN) tablet Take 1 tablet by mouth daily. (Patient not taking: Reported on 05/07/2021)     nortriptyline (PAMELOR) 50 MG capsule Take by mouth.     nortriptyline (PAMELOR) 75 MG capsule Take 75 mg by mouth at bedtime. (Patient not taking: Reported on 05/07/2021)     nortriptyline (PAMELOR) 75 MG capsule Take 1 capsule by mouth at bedtime. (Patient not taking: Reported on 05/07/2021)     nystatin ointment  (MYCOSTATIN) SMARTSIG:1 Topical Daily PRN (Patient not taking: Reported on 05/07/2021)     omeprazole (PRILOSEC) 20 MG capsule Take 20 mg by mouth daily.     omeprazole (PRILOSEC) 20 MG capsule Take 1 capsule by mouth daily.     phenazopyridine (PYRIDIUM) 100 MG tablet Take by mouth. (Patient not taking: Reported on 05/07/2021)     phenazopyridine (PYRIDIUM) 200 MG tablet Take 1 tablet (200 mg total) by mouth 3 (three) times daily. (Patient not taking: Reported on 05/07/2021) 6 tablet 0   polyethylene glycol (MIRALAX / GLYCOLAX) 17 g packet Take 17 g by mouth daily as needed.     predniSONE (DELTASONE) 20 MG tablet Take 3 tabs qd x 3 days, then 2 tabs qd x 3 days, then 1 tab qd x 3 days, then 1/2 tab qd x 4 days (Patient not taking: Reported on 05/07/2021)  Propylene Glycol (SYSTANE COMPLETE OP) Apply 1 drop to eye in the morning and at bedtime. Both eyes for dry eye     tamsulosin (FLOMAX) 0.4 MG CAPS capsule Take 0.4 mg by mouth daily after supper.     tamsulosin (FLOMAX) 0.4 MG CAPS capsule Take 1 capsule by mouth daily. (Patient not taking: Reported on 05/07/2021)     tamsulosin (FLOMAX) 0.4 MG CAPS capsule Take by mouth. (Patient not taking: Reported on 05/07/2021)     tiZANidine (ZANAFLEX) 4 MG tablet Take by mouth. (Patient not taking: Reported on 05/07/2021)     triamcinolone cream (KENALOG) 0.5 % Apply 1 application topically 3 (three) times daily as needed (apply to itching areas).     trimethoprim (TRIMPEX) 100 MG tablet Take by mouth. (Patient not taking: Reported on 05/07/2021)       Discharge Medications: Please see discharge summary for a list of discharge medications.  Relevant Imaging Results:  Relevant Lab Results:   Additional Information SSN: [id number]  Archie Endo, LCSW

## 2021-09-12 NOTE — Progress Notes (Signed)
CSW completed FL2 and sent patient's referral to several local facilities for review.  Madilyn Fireman, MSW, LCSW Transitions of Care   Clinical Social Worker II [phone]

## 2021-09-13 ENCOUNTER — Other Ambulatory Visit: Payer: Self-pay

## 2021-09-13 ENCOUNTER — Emergency Department (HOSPITAL_COMMUNITY)
Admission: EM | Admit: 2021-09-13 | Discharge: 2021-09-13 | Disposition: A | Discharge status: Home / Self Care | Payer: Medicare HMO | Source: Home / Self Care | Attending: Emergency Medicine | Admitting: Emergency Medicine

## 2021-09-13 DIAGNOSIS — R45851 Suicidal ideations: Secondary | ICD-10-CM | POA: Diagnosis not present

## 2021-09-13 DIAGNOSIS — Z7982 Long term (current) use of aspirin: Secondary | ICD-10-CM | POA: Insufficient documentation

## 2021-09-13 DIAGNOSIS — Z79899 Other long term (current) drug therapy: Secondary | ICD-10-CM | POA: Insufficient documentation

## 2021-09-13 DIAGNOSIS — F32A Depression, unspecified: Secondary | ICD-10-CM | POA: Insufficient documentation

## 2021-09-13 DIAGNOSIS — I10 Essential (primary) hypertension: Secondary | ICD-10-CM | POA: Insufficient documentation

## 2021-09-13 DIAGNOSIS — Z5900 Homelessness unspecified: Secondary | ICD-10-CM

## 2021-09-13 DIAGNOSIS — R0602 Shortness of breath: Secondary | ICD-10-CM | POA: Diagnosis not present

## 2021-09-13 NOTE — ED Notes (Signed)
Awake watching TV makes multiple request of staff.

## 2021-09-13 NOTE — Progress Notes (Signed)
RNCM reviewed MAR from [place]. MAR reviewed and given to [place] for update and review. No additional TOC needs at this time.

## 2021-09-13 NOTE — ED Provider Notes (Signed)
[facility] DEPT Provider Note   CSN: [id number] Arrival date & time: 09/13/21  1620     History  Chief complaint: Homelessness  MD SMOLA is a 71 y.o. male.  HPI  Patient has a history of BPH, chronic pain condition, depression, hypertension, rheumatoid arthritis and homelessness.  Patient was in the emergency room yesterday stayed overnight and was discharged from the ED a couple hours ago.  Patient presented with a complaint of needing to be placed in a nursing facility.  Patient was at [facility] and left on his own accord.  Patient tried to go to the shelter but could not stay at [facility].  Patient had a medical evaluation yesterday that included covid tests.  Metabolic panel CBC and serial troponins.  All those tests were reassuring.  Patient was assessed by the psychiatry team because he indicated that he would kill himself if he could not be placed in a nursing facility.  Patient was seen by the psychiatry team this morning.  According to the notes the patient told them he was not stupid.  He claimed to be suicidal when he was in desperate need of help.  Patient told the psychiatry team that he made the suicidal statements so he could get placed in a nursing facility.  Patient was discharged this afternoon.  Social work also saw the patient.  Patient returned back to the ED a couple hours later.  Patient states he just wants to be held in the ED 72 hours so he can get placed within the [place].  Home Medications Prior to Admission medications   Medication Sig Start Date End Date Taking? Authorizing Provider  acetaminophen (TYLENOL) 325 MG tablet Take 650 mg by mouth every 6 (six) hours as needed for mild pain.    [provider]  aspirin 81 MG chewable tablet Chew 81 mg by mouth daily.    [provider]  buprenorphine (BUTRANS) 5 MCG/HR Howe onto the skin once a week.    [provider]  CHLORASEPTIC 1.4 %  LIQD Use as directed 1 spray in the mouth or throat every 6 (six) hours as needed for throat irritation / pain. 01/18/21   [provider]  cholecalciferol (VITAMIN D3) 25 MCG (1000 UNIT) tablet Take 2,000 Units by mouth daily.    [provider]  diclofenac Sodium (VOLTAREN) 1 % GEL Apply 2 g topically every 8 (eight) hours as needed (pain in left elbow).    [provider]  finasteride (PROSCAR) 5 MG tablet Take 5 mg by mouth daily.    [provider]  gabapentin (NEURONTIN) 400 MG capsule Take 400 mg by mouth 3 (three) times daily.    [provider]  guaiFENesin (ROBITUSSIN) 100 MG/5ML liquid Take 200 mg by mouth every 6 (six) hours as needed for cough or to loosen phlegm.    [provider]  hydroxychloroquine (PLAQUENIL) 200 MG tablet Take 200 mg by mouth 2 (two) times daily.    [provider]  ketoconazole (NIZORAL) 2 % cream Apply 1 application topically daily. Patient taking differently: Apply 1 application topically daily. To the bottom of both feet 06/12/21   [name], MD  losartan (COZAAR) 25 MG tablet Take 25 mg by mouth daily. 06/23/18   [provider]  methocarbamol (ROBAXIN) 500 MG tablet Take 1 tablet (500 mg total) by mouth every 8 (eight) hours as needed for muscle spasms. 01/07/21   [name], MD  metoprolol succinate (TOPROL-XL) 50 MG 24 hr tablet Take 50 mg by mouth daily. Take with or immediately following a meal.    [provider]  nortriptyline (PAMELOR) 50 MG capsule Take 100 mg by mouth at bedtime. 02/13/21   [provider]  [facility] powder Apply 1 application topically 2 (two) times daily as needed for rash. Groin folds 07/31/21   [provider]  omeprazole (PRILOSEC) 20 MG capsule Take 20 mg by mouth daily.    [provider]  polyethylene glycol (MIRALAX / GLYCOLAX) 17 g packet Take 17 g by mouth daily.    [provider]  SYSTANE COMPLETE 0.6  % SOLN Place 1 drop into both eyes in the morning and at bedtime. 08/28/21   [provider]  tamsulosin (FLOMAX) 0.4 MG CAPS capsule Take 0.4 mg by mouth daily after supper.    [provider]  triamcinolone cream (KENALOG) 0.5 % Apply 1 application topically 3 (three) times daily as needed (itching).    [provider]      Allergies    Quetiapine, Tramadol, Trazodone and nefazodone, and Lisinopril    Review of Systems   Review of Systems  Physical Exam Updated Vital Signs BP (!) 158/102 (BP Location: Left Arm)    Pulse (!) 101    Temp 98 F (36.7 C) (Oral)    Resp 16    SpO2 100%  Physical Exam Vitals and nursing note reviewed.  Constitutional:      General: He is not in acute distress.    Appearance: He is well-developed.  HENT:     Head: Normocephalic and atraumatic.     Right Ear: External ear normal.     Left Ear: External ear normal.  Eyes:     General: No scleral icterus.       Right eye: No discharge.        Left eye: No discharge.     Conjunctiva/sclera: Conjunctivae normal.  Neck:     Trachea: No tracheal deviation.  Cardiovascular:     Rate and Rhythm: Normal rate.  Pulmonary:     Effort: Pulmonary effort is normal. No respiratory distress.     Breath sounds: No stridor.  Abdominal:     General: There is no distension.  Musculoskeletal:        General: No swelling or deformity.     Cervical back: Neck supple.  Skin:    General: Skin is warm and dry.     Findings: No rash.  Neurological:     Mental Status: He is alert.     Cranial Nerves: No cranial nerve deficit.     Comments: Wheelchair-bound  Psychiatric:        Mood and Affect: Mood is depressed.        Behavior: Behavior is aggressive.    ED Results / Procedures / Treatments   Labs (all labs ordered are listed, but only abnormal results are displayed) Labs Reviewed - No data to display  EKG None  Radiology DG Chest 2 View  Result Date: 09/12/2021 CLINICAL  DATA:  Shortness of breath EXAM: CHEST - 2 VIEW COMPARISON:  None. FINDINGS: Cardiac silhouette and mediastinal contours are within normal limits. The lungs are clear. No pleural effusion or pneumothorax. Mild multilevel degenerative disc changes of the thoracic spine. IMPRESSION: No active cardiopulmonary disease. Electronically Signed   By: Yvonne Kendall   On: 09/12/2021 12:34    Procedures Procedures    Medications Ordered in ED  Medications - No data to display  ED Course/ Medical Decision Making/ A&P                           Medical Decision Making  Patient presents to the ED with a request to be placed in a nursing facility.  Patient unfortunately was held overnight in the ED already for the same issue.  He was just discharged a couple hours ago.  Patient does threaten to harm himself if we will not place him in a nursing facility however he already admitted to psychiatry today that he saying that that we will help him and not that he has true intent to harm himself.  Unfortunately there are no additional resources that we have for the patient here today.  He was given shelter references and recommendations on nursing home placement.        Final Clinical Impression(s) / ED Diagnoses Final diagnoses:  Homelessness    Rx / DC Orders ED Discharge Orders     None         Dorie Rank, MD 09/13/21 9295

## 2021-09-13 NOTE — ED Provider Notes (Signed)
Cleared by psychiatry and discharge.   Lennice Sites, DO 09/13/21 1226

## 2021-09-13 NOTE — Progress Notes (Signed)
[name] has spoken with several facilities in the area and outside the [place]. Pt was declined by these facilities. CSW is unable to assist with finding pt a LTC bed. Pt will need to work with his PCP with finding LTC placement.   CSW spoke with pt and informed him about the failed effects of finding a placement. Pt stated" Im not going back to the street."  stated "you can send me to a psych ward" CSW explained to pt he can go to the [facility] as their white flag shelter will be open this weekend. Pt stated," you will have to send me to [place] not leaving". CSW informed pt an appointment will be made for him t follow up with his PCP to assist with placement.     Jeffrey Key.Jeffrey Key, MSW, [place]   Transitions of Care Clinical Social Worker I Direct Dial: [phone]   Fax: [phone] [email or web address]

## 2021-09-13 NOTE — Progress Notes (Signed)
RNCM scheduled an ED follow up appointment with patient's PCP. Patient needs assistance with long term nursing placement.   Dr. Seward Carol.  Appointment: 09/23/2021 at 2:45p  Patient can call the office to see if there are any available appointments prior to 09/23/21.  No additional TOC needs at this time.
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
## 2021-09-13 NOTE — Progress Notes (Signed)
CSW spoke with [facility] and requested MAR list to be faxed over.    Arlie Solomons.Earma Nicolaou, MSW, [place]   Transitions of Care Clinical Social Worker I Direct Dial: [phone]   Fax: [phone] [email or web address]

## 2021-09-13 NOTE — Discharge Instructions (Signed)
Please refer to the instructions provided earlier today

## 2021-09-13 NOTE — ED Notes (Addendum)
Jeffrey Key slept most of the night falling asleep with no sleep disturbance or night tares detected. Prior to sleep his TTS was completed. He made no further complaints of back pain after receiving his HS medicaton. The plan for Jeffrey Key is to try and find placement at an extend care facility and his information has been faxed out in search of a provider.

## 2021-09-13 NOTE — ED Notes (Signed)
sleeping

## 2021-09-13 NOTE — ED Notes (Signed)
Patient left before the triage and discharge process could be completed.

## 2021-09-13 NOTE — ED Notes (Signed)
Remains asleep pt changed to burgundy scrubs  with HS care prior to going to sleep.

## 2021-09-13 NOTE — BH Assessment (Signed)
[facility] Assessment Progress Note   Per [name], NP, this voluntary pt does not require psychiatric hospitalization at this time.  Pt is psychiatrically cleared.  Discharge instructions include referral information for [facility].  A TOC consult has been ordered to address pt's psychosocial needs.  EDP Lennice Sites, DO and pt's nurse, Dekina, have been notified.  Jalene Mullet, [place] Triage Specialist [phone]

## 2021-09-13 NOTE — Discharge Instructions (Addendum)
For your behavioral health needs you are advised to follow up with [facility] at your earliest opportunity:      [facility]      [place], [place] [address]      [phone]      They offer psychiatry/medication management and therapy.  New patients are seen in their walk-in clinic.  Walk-in hours are Monday - Thursday from 8:00 am - 11:00 am for psychiatry, and Friday from 1:00 pm - 4:00 pm for therapy.  Walk-in patients are seen on a first come, first served basis, so try to arrive as early as possible for the best chance of being seen the same day.  Please note that to be eligible for services you must bring an ID or a piece of mail with your name and a [facility] address.

## 2021-09-13 NOTE — ED Notes (Signed)
Pt sleeping no distress noted respirations 18 and easy skin color appropriate for ethnicity

## 2021-09-16 ENCOUNTER — Emergency Department (HOSPITAL_COMMUNITY)
Admission: EM | Admit: 2021-09-16 | Discharge: 2021-09-16 | Disposition: A | Discharge status: Home / Self Care | Payer: Medicare HMO | Attending: Emergency Medicine | Admitting: Emergency Medicine

## 2021-09-16 ENCOUNTER — Ambulatory Visit (INDEPENDENT_AMBULATORY_CARE_PROVIDER_SITE_OTHER)
Admission: EM | Admit: 2021-09-16 | Discharge: 2021-09-16 | Disposition: A | Discharge status: Home / Self Care | Payer: Medicare HMO | Source: Home / Self Care

## 2021-09-16 ENCOUNTER — Other Ambulatory Visit: Payer: Self-pay

## 2021-09-16 DIAGNOSIS — Z5902 Unsheltered homelessness: Secondary | ICD-10-CM | POA: Insufficient documentation

## 2021-09-16 DIAGNOSIS — R45851 Suicidal ideations: Secondary | ICD-10-CM | POA: Insufficient documentation

## 2021-09-16 DIAGNOSIS — G8929 Other chronic pain: Secondary | ICD-10-CM | POA: Insufficient documentation

## 2021-09-16 DIAGNOSIS — Z5900 Homelessness unspecified: Secondary | ICD-10-CM

## 2021-09-16 DIAGNOSIS — I10 Essential (primary) hypertension: Secondary | ICD-10-CM | POA: Insufficient documentation

## 2021-09-16 DIAGNOSIS — F32A Depression, unspecified: Secondary | ICD-10-CM | POA: Insufficient documentation

## 2021-09-16 DIAGNOSIS — Z765 Malingerer [conscious simulation]: Secondary | ICD-10-CM

## 2021-09-16 DIAGNOSIS — M069 Rheumatoid arthritis, unspecified: Secondary | ICD-10-CM | POA: Insufficient documentation

## 2021-09-16 DIAGNOSIS — Z7982 Long term (current) use of aspirin: Secondary | ICD-10-CM | POA: Diagnosis not present

## 2021-09-16 MED ORDER — METOPROLOL SUCCINATE ER 25 MG PO TB24
25.0000 mg | ORAL_TABLET | Freq: Every day | ORAL | Status: DC
  Administered 2021-09-16: 25 mg via ORAL
  Filled 2021-09-16: qty 1

## 2021-09-16 MED ORDER — PANTOPRAZOLE SODIUM 40 MG PO TBEC
40.0000 mg | DELAYED_RELEASE_TABLET | Freq: Once | ORAL | Status: AC
  Administered 2021-09-16: 40 mg via ORAL
  Filled 2021-09-16: qty 1

## 2021-09-16 MED ORDER — GABAPENTIN 300 MG PO CAPS
300.0000 mg | ORAL_CAPSULE | Freq: Once | ORAL | Status: AC
  Administered 2021-09-16: 300 mg via ORAL
  Filled 2021-09-16: qty 1

## 2021-09-16 MED ORDER — ACETAMINOPHEN 325 MG PO TABS
650.0000 mg | ORAL_TABLET | Freq: Once | ORAL | Status: AC
  Administered 2021-09-16: 650 mg via ORAL
  Filled 2021-09-16: qty 2

## 2021-09-16 NOTE — ED Notes (Addendum)
RN Case manager called for consult

## 2021-09-16 NOTE — ED Notes (Signed)
This RN spoke with MD Judith Blonder - going to change pts foley and DC with it in place
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
## 2021-09-16 NOTE — Progress Notes (Signed)
AVS reviewed with patient by Hoyle Sauer, NP.  Patient discharged accompanied by [place] police officer.  Patient discharged in stable condition; no acute distress noted.
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
## 2021-09-16 NOTE — ED Notes (Signed)
Taxi called for patient - pt got into wheelchair indep and wheeled himself to lobby - pt given taxi voucher

## 2021-09-16 NOTE — ED Notes (Signed)
RN Case manager has spoken to pt and MD has cleared pt for DC - this RN trying to go over paper work with pt and tell pt that the RN case manager can set up a taxi voucher for him to the [facility] - pt cussing at this RN and yelling in hallway - this RN told pt that per RN case manager the [facility].  Pt now telling this RN that he has a foley in him that has not been changed in 3 months - MD notified
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
## 2021-09-16 NOTE — ED Notes (Signed)
Unable to obtain labs pt moved arm x2

## 2021-09-16 NOTE — ED Notes (Addendum)
Pt has suprapubic catheter - MD McIlwain to change - pt moved to rm 13

## 2021-09-16 NOTE — Discharge Instructions (Addendum)
Please contact one of the following facilities to start medication management and therapy services:  ? ?[facility] ?[address]  ?[place], [place] [address] ?[phone]  ? ?[facility]  ?[address] ?[place], [place] [address] ?[phone] ? ?[facility]  ?[address], [place], [place] [address] ?[phone] ? ?[facility]  ?[address]  ?[place], [place] [address] ?[phone] ? ?[facility]  ?[address] ?[place], [place] [address] ?[phone] ? ?[facility]  ?[address],  ?[place], [place] [address] ?[phone]  ?
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
## 2021-09-16 NOTE — BH Assessment (Signed)
Patient is a 71 yo male reporting to [facility] requesting assistance with case management for ongoing psycho-social needs. Patient denies any S/I, H/I or AVH. Pt is currently unaccompanied and has been homeless for 40 years he reports. Pt is not currently receiving any psychiatric services and denies any psychiatric treatment in the past. Pt reports that he left [facility] on 1/10 and was discharged to [facility]. Patient was seen and evaluated by Chana Bode NP who recommended patient be referred back to [facility] to assist with ongoing needs.

## 2021-09-16 NOTE — ED Notes (Addendum)
RN Case Manager at bedside - pt given [place] sandwich bag and juice

## 2021-09-16 NOTE — ED Notes (Signed)
Patient verbalizes understanding of discharge instructions. Opportunity for questioning and answers were provided. Armband removed by staff, pt discharged from ED via wheelchair. Taxi called.

## 2021-09-16 NOTE — ED Notes (Signed)
Old catheter removed and new suprapubic catheter placed by MD Judith Blonder
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
## 2021-09-16 NOTE — ED Notes (Addendum)
RN Case manager notified RN that [facility] is not open at this time - MD and charge notified - pt to be DC - pt notified that if he could give [place] an address a taxi voucher could be provided

## 2021-09-16 NOTE — ED Notes (Signed)
MD at bedside. 

## 2021-09-16 NOTE — Discharge Instructions (Addendum)
Please refer to instructions given to you at your prior ED visit.
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
## 2021-09-18 ENCOUNTER — Other Ambulatory Visit: Payer: Self-pay

## 2021-09-18 ENCOUNTER — Encounter (HOSPITAL_COMMUNITY): Payer: Self-pay | Admitting: Emergency Medicine

## 2021-09-18 ENCOUNTER — Other Ambulatory Visit (HOSPITAL_COMMUNITY): Payer: Self-pay

## 2021-09-18 ENCOUNTER — Emergency Department (HOSPITAL_COMMUNITY)
Admission: EM | Admit: 2021-09-18 | Discharge: 2021-09-18 | Disposition: A | Discharge status: Home / Self Care | Payer: Medicare HMO | Attending: Emergency Medicine | Admitting: Emergency Medicine

## 2021-09-18 ENCOUNTER — Encounter: Payer: Self-pay | Admitting: *Deleted

## 2021-09-18 ENCOUNTER — Encounter: Payer: Self-pay | Admitting: Internal Medicine

## 2021-09-18 ENCOUNTER — Ambulatory Visit (INDEPENDENT_AMBULATORY_CARE_PROVIDER_SITE_OTHER): Payer: Medicare HMO | Admitting: Internal Medicine

## 2021-09-18 VITALS — BP 158/92 | HR 105 | Temp 98.4°F | Wt 283.6 lb

## 2021-09-18 DIAGNOSIS — I10 Essential (primary) hypertension: Secondary | ICD-10-CM | POA: Diagnosis not present

## 2021-09-18 DIAGNOSIS — F339 Major depressive disorder, recurrent, unspecified: Secondary | ICD-10-CM

## 2021-09-18 DIAGNOSIS — Z5900 Homelessness unspecified: Secondary | ICD-10-CM

## 2021-09-18 DIAGNOSIS — R600 Localized edema: Secondary | ICD-10-CM

## 2021-09-18 DIAGNOSIS — Z7982 Long term (current) use of aspirin: Secondary | ICD-10-CM | POA: Diagnosis not present

## 2021-09-18 DIAGNOSIS — C61 Malignant neoplasm of prostate: Secondary | ICD-10-CM

## 2021-09-18 DIAGNOSIS — Z765 Malingerer [conscious simulation]: Secondary | ICD-10-CM

## 2021-09-18 DIAGNOSIS — M0579 Rheumatoid arthritis with rheumatoid factor of multiple sites without organ or systems involvement: Secondary | ICD-10-CM | POA: Diagnosis not present

## 2021-09-18 DIAGNOSIS — K219 Gastro-esophageal reflux disease without esophagitis: Secondary | ICD-10-CM | POA: Diagnosis not present

## 2021-09-18 MED ORDER — METOPROLOL SUCCINATE ER 50 MG PO TB24
50.0000 mg | ORAL_TABLET | Freq: Every day | ORAL | 2 refills | Status: AC
  Filled 2021-09-18: qty 30, 30d supply, fill #0

## 2021-09-18 MED ORDER — GABAPENTIN 400 MG PO CAPS
400.0000 mg | ORAL_CAPSULE | Freq: Three times a day (TID) | ORAL | 2 refills | Status: AC
Start: 2021-09-18 — End: ?
  Filled 2021-09-18: qty 90, 30d supply, fill #0

## 2021-09-18 MED ORDER — HYDROXYCHLOROQUINE SULFATE 200 MG PO TABS
200.0000 mg | ORAL_TABLET | Freq: Two times a day (BID) | ORAL | 2 refills | Status: AC
Start: 2021-09-18 — End: 2021-12-17
  Filled 2021-09-18: qty 60, 30d supply, fill #0

## 2021-09-18 MED ORDER — NORTRIPTYLINE HCL 50 MG PO CAPS
100.0000 mg | ORAL_CAPSULE | Freq: Every day | ORAL | 2 refills | Status: AC
  Filled 2021-09-18: qty 60, 30d supply, fill #0

## 2021-09-18 MED ORDER — OMEPRAZOLE 20 MG PO CPDR
20.0000 mg | DELAYED_RELEASE_CAPSULE | Freq: Every day | ORAL | 2 refills | Status: AC
  Filled 2021-09-18: qty 30, 30d supply, fill #0

## 2021-09-18 MED ORDER — LOSARTAN POTASSIUM-HCTZ 50-12.5 MG PO TABS
1.0000 | ORAL_TABLET | Freq: Every day | ORAL | 11 refills | Status: AC
  Filled 2021-09-18: qty 30, 30d supply, fill #0

## 2021-09-18 MED ORDER — ACETAMINOPHEN 325 MG PO TABS
650.0000 mg | ORAL_TABLET | Freq: Four times a day (QID) | ORAL | 0 refills | Status: AC | PRN
  Filled 2021-09-18: qty 120, 15d supply, fill #0

## 2021-09-18 NOTE — Assessment & Plan Note (Addendum)
Vitals with BMI 09/18/2021 09/18/2021 09/16/2021  Height - - -  Weight 283 lbs 10 oz - -  BMI - - -  Systolic [phone]  Diastolic 92 122 74  Pulse 105 101 95  Some encounter information is confidential and restricted. Go to Review Flowsheets activity to see all data.   Patient presents to the clinic for refills on his chronic medications that he has been out of since leaving AMA from [facility]. He was taking losartan 50 mg daily, metoprolol 50 mg daily. He is currently uncontrolled. Given lower extremity edema, will restart his metoprolol 50 mg daily and start Hyzaar for the antidiuretic effect. Patient instructed to follow up in 2 weeks for follow up.  - DC Losartan 50 mg daily - Continue Metoprolol 50 mg daily - Start Hyzaar 50-12.5 mg daily - FU in 2 weeks - BMP at next visit

## 2021-09-18 NOTE — ED Triage Notes (Addendum)
Pt BIB [facility], found by [employer] sitting in his wheelchair, pt became combative when EMS arrived. Pt c/o chronic back pain and bilateral leg swelling.

## 2021-09-18 NOTE — Assessment & Plan Note (Signed)
-   Refill on prilosec 20 mg daily

## 2021-09-18 NOTE — Assessment & Plan Note (Addendum)
Patient with a history of rheumatoid arthritis with + RF presents today for refills on his plaquenil 200 mg twice daily. On examination today his joints do show chronic joint deformities, grip strength 5/5, no erythema or tenderness to palpation of the MCP and PIP joints appreciated.  - Refill Plaquenil 200 mg twice daily
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
## 2021-09-18 NOTE — Patient Instructions (Signed)
Jeffrey Key,   It was a pleasure meeting you today. Today we discussed your medications, leg swelling, loss to follow up with urology, and social needs. For your medications, I am restarting them today and will have them filled at the [facility]. For your leg swelling, it is likely secondary to your diet. I am going to combine your blood pressure pill, losartan with a medication called hydrochlorothiazide (HCTZ) which will help with the swelling. I am going to call back your urologist to help coordinate care, and lastly I am going to get our social worker involved to help coordinate your care and find additional resources.  We will see you back in 2 weeks to follow up with your blood pressure.

## 2021-09-18 NOTE — Discharge Instructions (Addendum)
Get your medications filled tomorrow morning from the [facility]. Follow up with your PCP and Urology as recommended for long term management of your chronic medical conditions.

## 2021-09-18 NOTE — Progress Notes (Signed)
° °  CC: Homelessness, Rheumatoid Arthritis, Hypertension, lower extremity swelling, prostate cancer  HPI:  [name] is a 71 y.o. person, with a PMH noted below, who presents to the clinic Homelessness, Rheumatoid Arthritis, Hypertension, lower extremity swelling, prostate cancer. To see the management of their acute and chronic conditions, please see the A&P note under the Encounters tab.   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  Eyes:  Negative for blurred vision and double vision.  Respiratory:  Negative for cough.   Cardiovascular:  Positive for leg swelling. Negative for orthopnea and claudication.  Gastrointestinal:  Negative for abdominal pain, diarrhea, nausea and vomiting.  Genitourinary:  Negative for dysuria.  Musculoskeletal:  Positive for back pain. Negative for falls, joint pain and myalgias.  Skin:  Negative for itching.  Psychiatric/Behavioral:  Positive for depression. Negative for suicidal ideas.     Physical Exam:  Vitals:   09/18/21 1508  BP: (!) 158/92  Pulse: (!) 105  Temp: 98.4 F (36.9 C)  TempSrc: Oral  SpO2: 100%  Weight: 283 lb 9.6 oz (128.6 kg)   Physical Exam Constitutional:      General: He is not in acute distress.    Appearance: He is obese. He is not ill-appearing.     Comments: Disheveled,resting in wheelchair, completing full sentences  HENT:     Head: Normocephalic and atraumatic.  Cardiovascular:     Rate and Rhythm: Regular rhythm. Tachycardia present.     Pulses: Normal pulses.  Abdominal:     General: Abdomen is flat. There is no  distension.     Palpations: Abdomen is soft.     Tenderness: There is no abdominal tenderness.  Musculoskeletal:        General: Tenderness present.     Right lower leg: Edema present.     Left lower leg: Edema present.     Comments: Unable to fully assess bilateral lower extremities 2/2 to tenderness, no tenderness distal to the knees bilaterally. Hand grip is 5/5, no erythema or tenderness of the MCP or PIP joints  Neurological:     Mental Status: He is alert and oriented to person, place, and time.  Psychiatric:     Comments: Irritable      Assessment & Plan:   See Encounters Tab for problem based charting.  Patient discussed with Dr. Evette Doffing
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
## 2021-09-18 NOTE — Assessment & Plan Note (Addendum)
Patient presents with a Hx of prostate cancer and is followed by urology. Patient states that a suprapubic catheter was placed 2/2 to his prostate cancer, and was meant to have radiation therapy, but unfortunately was lost to follow up as he got COVID, and has been unable to reach his radiologist's office through the phone. Discussed with the patient that I will reach out, but the patient is hesitant as he has had no been able to get in touch with provider.   On chart review, it appears that Mr. Belvedere originally diagnosed with prostate cancer on 05/2019 in [place], and was under the care of Dr. Ree Edman with a PSA of 9, where he originally opted for surveillance. He had chronic urinary retention and was managed with a foley catheter initially, but sought care under Dr. Pearline Cables in 2021 where he underwent a repeat transrectal U/S with 12 biopsies of the prostate on 07/20/20, where three of biopsies were positive on the left side. At this time he declined a referral to radiation oncology. 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 He requested a second opinion with Dr. Milford Cage on 04/2021 for management, and was then referred to Dr. Tammi Klippel with radiation oncology.  - Will reach out to Dr. Johny Shears office

## 2021-09-18 NOTE — ED Provider Notes (Signed)
[name]    CSN: [id number] Arrival date & time: 09/18/21 1854  History No chief complaint on file.   Jeffrey Key is a 71 y.o. male with history of recent homelessness after leaving [facility] AMA. He states he was tricked into signing out but regardless he has been living on the streets since then with frequent visits to ED and [place] to get help with his housing. He has been given numerous resources and SW has spent considerable time trying to get him into more stable housing but thus far no openings have been available. He was at PCP office[facility] today to get refills on his medications and again they attempted to get their Case Manager to help but he wheeled himself out of the clinic. He Did not get his meds filled yet. He has no new complaints tonight. Was found by police in his wheelchair and EMS was called at which point apparently he began yelling at them.    Home Medications Prior to Admission medications   Medication Sig Start Date End Date Taking? Authorizing Provider  acetaminophen (TYLENOL) 325 MG tablet Take 2 tablets (650 mg total) by mouth every 6 (six) hours as needed for mild pain. 09/18/21 12/17/21  [name], MD  aspirin 81 MG chewable tablet Chew 81 mg by mouth daily.    [provider]  buprenorphine (BUTRANS) 5 MCG/HR [place] onto the skin once a week.    [provider]  CHLORASEPTIC 1.4 % LIQD Use as directed 1 spray in the mouth or throat every 6 (six) hours as needed for throat irritation / pain. 01/18/21   [provider]  cholecalciferol (VITAMIN D3) 25 MCG (1000 UNIT) tablet Take 2,000 Units by mouth daily.    [provider]  diclofenac Sodium (VOLTAREN) 1 % GEL Apply 2 g topically every 8 (eight) hours as needed (pain in left elbow).    [provider]  finasteride (PROSCAR) 5 MG tablet Take 5 mg by mouth daily.    [provider]  gabapentin (NEURONTIN)  400 MG capsule Take 1 capsule (400 mg total) by mouth 3 (three) times daily. 09/18/21   [name], MD  guaiFENesin (ROBITUSSIN) 100 MG/5ML liquid Take 200 mg by mouth every 6 (six) hours as needed for cough or to loosen phlegm.    [provider]  hydroxychloroquine (PLAQUENIL) 200 MG tablet Take 1 tablet (200 mg total) by mouth 2 (two) times daily. 09/18/21 12/17/21  [name], MD  ketoconazole (NIZORAL) 2 % cream Apply 1 application topically daily. Patient taking differently: Apply 1 application topically daily. To the bottom of both feet 06/12/21   [name], MD  losartan-hydrochlorothiazide (HYZAAR) 50-12.5 MG tablet Take 1 tablet by mouth daily. 09/18/21 09/18/22  [name], MD  methocarbamol (ROBAXIN) 500 MG tablet Take 1 tablet (500 mg total) by mouth every 8 (eight) hours as needed for muscle spasms. 01/07/21   [name], MD  metoprolol succinate (TOPROL-XL) 50 MG 24 hr tablet Take 1 tablet (50 mg total) by mouth daily. Take with or immediately following a meal. 09/18/21 12/17/21  [name], MD  nortriptyline (PAMELOR) 50 MG capsule Take 2 capsules (100 mg total) by mouth at bedtime. 09/18/21 12/17/21  [name], MD  [facility] powder Apply 1 application topically 2 (two) times daily as needed for rash. Groin folds 07/31/21   [provider]  omeprazole (PRILOSEC) 20 MG capsule Take 1 capsule (20 mg total)  by mouth daily. 09/18/21 12/17/21  [name], MD  polyethylene glycol (MIRALAX / GLYCOLAX) 17 g packet Take 17 g by mouth daily.    [provider]  SYSTANE COMPLETE 0.6 % SOLN Place 1 drop into both eyes in the morning and at bedtime. 08/28/21   [provider]  tamsulosin (FLOMAX) 0.4 MG CAPS capsule Take 0.4 mg by mouth daily after supper.    [provider]  triamcinolone cream (KENALOG) 0.5 % Apply 1 application topically 3 (three) times daily as needed (itching).    [provider]     Allergies     Quetiapine, Tramadol, Trazodone and nefazodone, and Lisinopril   Review of Systems   Review of Systems Please see HPI for pertinent positives and negatives  Physical Exam BP (!) 147/92 (BP Location: Left Arm)    Pulse (!) 107    Resp 18    SpO2 91%   Physical Exam Vitals and nursing note reviewed.  HENT:     Head: Normocephalic.     Nose: Nose normal.  Eyes:     Extraocular Movements: Extraocular movements intact.  Pulmonary:     Effort: Pulmonary effort is normal.  Musculoskeletal:        General: Normal range of motion.     Cervical back: Neck supple.  Skin:    Findings: No rash (on exposed skin).  Neurological:     Mental Status: He is alert and oriented to person, place, and time.  Psychiatric:     Comments: Argumentative    ED Results / Procedures / Treatments   EKG None  Procedures Procedures  Medications Ordered in the ED Medications - No data to display  Initial Impression and Plan  Patient with multiple recent visits for malingering here for same today. He has no new complaints or needs beyond seeking housing. No further ED workup is needed, he was advised to get his meds filled by PCP office tomorrow. Stable for discharge at this time.   ED Course       MDM Rules/Calculators/A&P Medical Decision Making Problems Addressed: Malingering: chronic illness or injury  Risk OTC drugs. Prescription drug management.    Final Clinical Impression(s) / ED Diagnoses Final diagnoses:  Malingering    Rx / DC Orders ED Discharge Orders     None        [name], MD 09/18/21 1945

## 2021-09-18 NOTE — Congregational Nurse Program (Signed)
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 Yes    Location Patient Served  [facility]    Visit Setting Church or Organization;Phone/Text/Email    Patient Status Homeless    [employer];Medicare    Insurance Referral N/A    Medication N/A    Medical Provider Yes    Screening Referrals N/A    Medical Referral [facility]    Medical Appointment Made [facility] P[facility]    Food N/A    Transportation Provided transportation assistance    Housing/Utilities No permanent housing    Interpersonal Safety N/A    Intervention Blood pressure;Support;Navigate Healthcare System    ED Visit Averted N/A    Life-Saving Intervention Made N/A            Client referred from [facility]. Client reports he was staying in [facility] for last 1.5 years and left AMA on January 11th. Client reports he has no medication or housing. He refuses to see the MD he was seeing at [facility]. He is sitting in a wheelchair in the [facility] day shelter. Contacted CN Helen at [employer] and an appt was made for [facility] today at 3:30. Gave client appt information and two bus passes. He reports he uses the bus. Completed referral form for [facility]. Currently there are no rooms available and he has been added to waiting list. Bernita Raisin RN CN

## 2021-09-19 ENCOUNTER — Telehealth: Payer: Self-pay | Admitting: *Deleted

## 2021-09-19 ENCOUNTER — Other Ambulatory Visit (HOSPITAL_COMMUNITY): Payer: Self-pay

## 2021-09-19 DIAGNOSIS — R600 Localized edema: Secondary | ICD-10-CM | POA: Insufficient documentation

## 2021-09-19 NOTE — Chronic Care Management (AMB) (Signed)
°  Care Management   Outreach Note  09/19/2021 Name: Jeffrey Key MRN: [id number] DOB: [birth year]  Referred by: Seward Carol, MD Reason for referral : Care Coordination (Initial outreach to schedule referral with RNCM and BSW)   An unsuccessful telephone outreach was attempted today. The patient was referred to the case management team for assistance with care management and care coordination.   Follow Up Plan:  We have been unable to make contact with the patient do to not having any contact information. Last known number was [facility] and rehabilitation. The care management team is available to follow up with the patient after provider conversation with the patient regarding recommendation for care management engagement and subsequent re-referral to the care management team. If patient returns call to provider office, please advise to call [place] at [phone] or Milus Height, [place] at [place] [facility]  Direct Dial: [phone]

## 2021-09-19 NOTE — Progress Notes (Signed)
Internal Medicine Clinic Attending ? ?Case discussed with Dr. Winters  At the time of the visit.  We reviewed the resident?s history and exam and pertinent patient test results.  I agree with the assessment, diagnosis, and plan of care documented in the resident?s note.  ?

## 2021-09-19 NOTE — Assessment & Plan Note (Signed)
Patient presents to the [facility] with concerns for bilateral lower extremity edema since leaving [facility]. On chart review he is not on a diuretic nor does he have history of cardiac dysfunction. He denies SHOB, chest pain. Endorses bilateral leg pain secondary to swelling.   On examination, patient has tenderness to palpation over the extremities 2/2 to swelling, there are no lesions, bullae, weeping, erythema, or purulence noted.   A/P: Patient presenting with bilateral lower extremity edema likely secondary to poor PO intake and wheelchair independence. Will start him on a diuretic combo today. Will reassess his swelling in two weeks.  - Hyzaar 50-12.5 mg daily

## 2021-09-22 ENCOUNTER — Emergency Department (HOSPITAL_COMMUNITY)
Admission: EM | Admit: 2021-09-22 | Discharge: 2021-09-22 | Disposition: A | Discharge status: Home / Self Care | Payer: Medicare HMO | Attending: Student | Admitting: Student

## 2021-09-22 ENCOUNTER — Other Ambulatory Visit: Payer: Self-pay

## 2021-09-22 ENCOUNTER — Encounter (HOSPITAL_COMMUNITY): Payer: Self-pay | Admitting: Emergency Medicine

## 2021-09-22 DIAGNOSIS — Z20822 Contact with and (suspected) exposure to covid-19: Secondary | ICD-10-CM | POA: Diagnosis not present

## 2021-09-22 DIAGNOSIS — M791 Myalgia, unspecified site: Secondary | ICD-10-CM | POA: Insufficient documentation

## 2021-09-22 DIAGNOSIS — Z79899 Other long term (current) drug therapy: Secondary | ICD-10-CM | POA: Insufficient documentation

## 2021-09-22 DIAGNOSIS — Z8616 Personal history of COVID-19: Secondary | ICD-10-CM | POA: Diagnosis not present

## 2021-09-22 DIAGNOSIS — Z8546 Personal history of malignant neoplasm of prostate: Secondary | ICD-10-CM | POA: Insufficient documentation

## 2021-09-22 DIAGNOSIS — E876 Hypokalemia: Secondary | ICD-10-CM | POA: Diagnosis not present

## 2021-09-22 DIAGNOSIS — Z5900 Homelessness unspecified: Secondary | ICD-10-CM | POA: Insufficient documentation

## 2021-09-22 DIAGNOSIS — I10 Essential (primary) hypertension: Secondary | ICD-10-CM | POA: Insufficient documentation

## 2021-09-22 DIAGNOSIS — D72829 Elevated white blood cell count, unspecified: Secondary | ICD-10-CM | POA: Insufficient documentation

## 2021-09-22 DIAGNOSIS — Z7982 Long term (current) use of aspirin: Secondary | ICD-10-CM | POA: Insufficient documentation

## 2021-09-22 LAB — RESP PANEL BY RT-PCR (FLU A&B, COVID) ARPGX2
Influenza A by PCR: NEGATIVE
Influenza B by PCR: NEGATIVE
SARS Coronavirus 2 by RT PCR: NEGATIVE

## 2021-09-22 LAB — CBC WITH DIFFERENTIAL/PLATELET
Abs Immature Granulocytes: 0.05 10*3/uL (ref 0.00–0.07)
Basophils Absolute: 0 10*3/uL (ref 0.0–0.1)
Basophils Relative: 0 %
Eosinophils Absolute: 0.2 10*3/uL (ref 0.0–0.5)
Eosinophils Relative: 2 %
HCT: 45.9 % (ref 39.0–52.0)
Hemoglobin: 15 g/dL (ref 13.0–17.0)
Immature Granulocytes: 0 %
Lymphocytes Relative: 6 %
Lymphs Abs: 0.8 10*3/uL (ref 0.7–4.0)
MCH: 29 pg (ref 26.0–34.0)
MCHC: 32.7 g/dL (ref 30.0–36.0)
MCV: 88.6 fL (ref 80.0–100.0)
Monocytes Absolute: 0.6 10*3/uL (ref 0.1–1.0)
Monocytes Relative: 4 %
Neutro Abs: 12.1 10*3/uL — ABNORMAL HIGH (ref 1.7–7.7)
Neutrophils Relative %: 88 %
Platelets: 193 10*3/uL (ref 150–400)
RBC: 5.18 MIL/uL (ref 4.22–5.81)
RDW: 12.5 % (ref 11.5–15.5)
WBC: 13.8 10*3/uL — ABNORMAL HIGH (ref 4.0–10.5)
nRBC: 0 % (ref 0.0–0.2)

## 2021-09-22 LAB — COMPREHENSIVE METABOLIC PANEL
ALT: 44 U/L (ref 0–44)
AST: 37 U/L (ref 15–41)
Albumin: 3.6 g/dL (ref 3.5–5.0)
Alkaline Phosphatase: 87 U/L (ref 38–126)
Anion gap: 11 (ref 5–15)
BUN: 31 mg/dL — ABNORMAL HIGH (ref 8–23)
CO2: 26 mmol/L (ref 22–32)
Calcium: 9 mg/dL (ref 8.9–10.3)
Chloride: 103 mmol/L (ref 98–111)
Creatinine, Ser: 1.41 mg/dL — ABNORMAL HIGH (ref 0.61–1.24)
GFR, Estimated: 54 mL/min — ABNORMAL LOW (ref >60)
Glucose, Bld: 114 mg/dL — ABNORMAL HIGH (ref 70–99)
Potassium: 3.3 mmol/L — ABNORMAL LOW (ref 3.5–5.1)
Sodium: 140 mmol/L (ref 135–145)
Total Bilirubin: 0.7 mg/dL (ref 0.3–1.2)
Total Protein: 6.9 g/dL (ref 6.5–8.1)

## 2021-09-22 LAB — BRAIN NATRIURETIC PEPTIDE: B Natriuretic Peptide: 13.4 pg/mL (ref 0.0–100.0)

## 2021-09-22 MED ORDER — POTASSIUM CHLORIDE CRYS ER 20 MEQ PO TBCR
40.0000 meq | EXTENDED_RELEASE_TABLET | Freq: Once | ORAL | Status: AC
Start: 2021-09-22 — End: 2021-09-22
  Administered 2021-09-22: 40 meq via ORAL
  Filled 2021-09-22: qty 2

## 2021-09-22 MED ORDER — MAGNESIUM OXIDE -MG SUPPLEMENT 400 (240 MG) MG PO TABS
800.0000 mg | ORAL_TABLET | Freq: Once | ORAL | Status: AC
  Administered 2021-09-22: 800 mg via ORAL
  Filled 2021-09-22: qty 2

## 2021-09-22 MED ORDER — ONDANSETRON 4 MG PO TBDP
4.0000 mg | ORAL_TABLET | Freq: Once | ORAL | Status: AC
Start: 2021-09-22 — End: 2021-09-22
  Administered 2021-09-22: 4 mg via ORAL
  Filled 2021-09-22: qty 1

## 2021-09-22 NOTE — ED Provider Notes (Signed)
[facility] EMERGENCY DEPARTMENT Provider Note  CSN: [id number] Arrival date & time: 09/22/21 1307  Chief Complaint(s) Generalized Pain  HPI Jeffrey Key is a 71 y.o. male who presents emergency department for generalized pain.  He has multiple previous presentations to the emergency department for lack of housing and recently left AMA from SNF.  Patient initially called EMS for a ride to [facility] which he left AMA few weeks ago, but upon further discussion with EMS he then began to complain of generalized pain.  On arrival he has no complaints and states that he has nowhere to and it is cold and rainy outside.  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    Depression    GERD (gastroesophageal reflux disease)    History of COVID-19    Hypertension    Rheumatoid arthritis ([place])    Wears dentures    Patient Active Problem List   Diagnosis Date Noted   Bilateral lower extremity edema 09/19/2021   Adjustment disorder with depressed mood    Suicidal ideation    Internal hemorrhoids 05/07/2021   Degeneration of intervertebral disc 05/07/2021   Cervicalgia 05/07/2021   Personality disorder ([place]) 05/07/2021   Insomnia 01/16/2020   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/2020   Other and unspecified hyperlipidemia 01/15/2020   Recurrent major depressive disorder ([place]) 01/15/2020   Encounter for removal of ureteral stent 06/23/2019   Cystitis 06/09/2019   Chronic midline low back pain with sciatica 06/09/2019   Malignant neoplasm of prostate ([place]) 06/06/2019   Severe obesity (BMI 35.0-39.9) with comorbidity ([place]) 05/16/2019   Urinary incontinence 02/18/2019   Bilateral hydronephrosis 02/17/2019   Elevated PSA, less than 10 ng/ml 01/18/2019   TB lung, 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   Non compliance with  medical treatment 07/30/2018   IFG (impaired fasting glucose) 07/12/2018   Essential hypertension 07/11/2018   Gastroesophageal reflux disease without esophagitis 07/11/2018   BPH (benign prostatic hyperplasia) 07/11/2018   Rheumatoid arthritis involving multiple sites with positive rheumatoid factor ([place]) 07/11/2018   Depression 07/04/2018   Homelessness 07/04/2018   Chronic pain syndrome 07/04/2018   Home Medication(s) Prior to Admission medications   Medication Sig Start Date End Date Taking? Authorizing Provider  acetaminophen (TYLENOL) 325 MG tablet Take 2 tablets (650 mg total) by mouth every 6 (six) hours as needed for mild pain. 09/18/21 12/17/21  [name], MD  aspirin 81 MG chewable tablet Chew 81 mg by mouth daily.    [provider]  buprenorphine (BUTRANS) 5 MCG/HR [place] onto the skin once a week.    [provider]  CHLORASEPTIC 1.4 % LIQD Use as directed 1 spray in the mouth or throat every 6 (six) hours as needed for throat irritation / pain. 01/18/21   [provider]  cholecalciferol (VITAMIN D3) 25 MCG (1000 UNIT) tablet Take 2,000 Units by mouth daily.    [provider]  diclofenac Sodium (VOLTAREN) 1 % GEL Apply 2 g topically every 8 (eight) hours as needed (pain in left elbow).    [provider]  finasteride (PROSCAR) 5 MG tablet Take 5 mg by mouth daily.    [provider]  gabapentin (NEURONTIN) 400 MG capsule Take 1 capsule (400 mg total) by mouth 3 (three) times daily. 09/18/21   [name], MD  guaiFENesin (ROBITUSSIN) 100 MG/5ML liquid Take 200 mg by mouth every 6 (six) hours as needed for cough or to loosen phlegm.    [provider]  hydroxychloroquine (PLAQUENIL) 200 MG tablet Take 1 tablet (200 mg total) by mouth 2 (two) times daily. 09/18/21 12/17/21  [name], MD  ketoconazole (NIZORAL) 2 % cream Apply 1 application topically daily. Patient taking differently: Apply 1  application topically daily. To the bottom of both feet 06/12/21   [name], MD  losartan-hydrochlorothiazide (HYZAAR) 50-12.5 MG tablet Take 1 tablet by mouth daily. 09/18/21 09/18/22  [name], MD  methocarbamol (ROBAXIN) 500 MG tablet Take 1 tablet (500 mg total) by mouth every 8 (eight) hours as needed for muscle spasms. 01/07/21   [name], MD  metoprolol succinate (TOPROL-XL) 50 MG 24 hr tablet Take 1 tablet (50 mg total) by mouth daily. Take with or immediately following a meal. 09/18/21 12/17/21  [name], MD  nortriptyline (PAMELOR) 50 MG capsule Take 2 capsules (100 mg total) by mouth at bedtime. 09/18/21 12/17/21  [name], MD  [facility] powder Apply 1 application topically 2 (two) times daily as needed for rash. Groin folds 07/31/21   [provider]  omeprazole (PRILOSEC) 20 MG capsule Take 1 capsule (20 mg total) by mouth daily. 09/18/21 12/17/21  [name], MD  polyethylene glycol (MIRALAX / GLYCOLAX) 17 g packet Take 17 g by mouth daily.    [provider]  SYSTANE COMPLETE 0.6 % SOLN Place 1 drop into both eyes in the morning and at bedtime. 08/28/21   [provider]  tamsulosin (FLOMAX) 0.4 MG CAPS capsule Take 0.4 mg by mouth daily after supper.    [provider]  triamcinolone cream (KENALOG) 0.5 % Apply 1 application topically 3 (three) times daily as needed (itching).    [provider]                                                                                                                                    Past Surgical History Past Surgical History:  Procedure Laterality Date   CYSTOSCOPY N/A 10/12/2020   Procedure: CYSTOSCOPY WITH SUPRA PUBIC TUBE PLACEMENT;  Surgeon: Janith Lima, MD;  Location: [facility];  Service: Urology;  Laterality: N/A;  ONLY NEEDS 30 MIN NEEDS SAME DAY COVID TEST DUE TO LIVING IN SKILLED NURSING   PROSTATE BIOPSY N/A 07/20/2020   Procedure: BIOPSY TRANSRECTAL  ULTRASONIC PROSTATE (TUBP);  Surgeon: Janith Lima, MD;  Location[facility];  Service: Urology;  Laterality: N/A;  ONLY NEEDS 30 MIN   Family History History reviewed. No pertinent family history.  Social History Social History   Tobacco Use   Smoking status: Unknown   Smokeless tobacco: Never  Vaping Use   Vaping Use: Unknown   Allergies Quetiapine, Tramadol, Trazodone and nefazodone, and Lisinopril  Review of Systems Review of Systems  All other  systems reviewed and are negative.  Physical Exam Vital Signs  I have reviewed the triage vital signs BP (!) 160/90 (BP Location: Right Arm)    Pulse (!) 111    Temp 97.7 F (36.5 C) (Oral)    Resp 20    SpO2 95%   Physical Exam Constitutional:      General: He is not in acute distress.    Appearance: Normal appearance.  HENT:     Head: Normocephalic and atraumatic.     Nose: No congestion or rhinorrhea.  Eyes:     General:        Right eye: No discharge.        Left eye: No discharge.     Extraocular Movements: Extraocular movements intact.     Pupils: Pupils are equal, round, and reactive to light.  Cardiovascular:     Rate and Rhythm: Normal rate and regular rhythm.     Heart sounds: No murmur heard. Pulmonary:     Effort: No respiratory distress.     Breath sounds: No wheezing or rales.  Abdominal:     General: There is no distension.     Tenderness: There is no abdominal tenderness.  Musculoskeletal:        General: Normal range of motion.     Cervical back: Normal range of motion.  Skin:    General: Skin is warm and dry.  Neurological:     General: No focal deficit present.     Mental Status: He is alert.    ED Results and Treatments Labs (all labs ordered are listed, but only abnormal results are displayed) Labs Reviewed  COMPREHENSIVE METABOLIC PANEL - Abnormal; Notable for the following components:      Result Value   Potassium 3.3 (*)    Glucose, Bld 114 (*)    BUN 31 (*)     Creatinine, Ser 1.41 (*)    GFR, Estimated 54 (*)    All other components within normal limits  CBC WITH DIFFERENTIAL/PLATELET - Abnormal; Notable for the following components:   WBC 13.8 (*)    Neutro Abs 12.1 (*)    All other components within normal limits  RESP PANEL BY RT-PCR (FLU A&B, COVID) ARPGX2  BRAIN NATRIURETIC PEPTIDE                                                                                                                          Radiology No results found.  Pertinent labs & imaging results that were available during my care of the patient were reviewed by me and considered in my medical decision making (see MDM for details).  Medications Ordered in ED Medications  ondansetron (ZOFRAN-ODT) disintegrating tablet 4 mg (4 mg Oral Given 09/22/21 1540)  potassium chloride SA (KLOR-CON M) CR tablet 40 mEq (40 mEq Oral Given 09/22/21 1541)  magnesium oxide (MAG-OX) tablet 800 mg (800 mg Oral Given 09/22/21 1541)  Procedures Procedures  (including critical care time)  Medical Decision Making / ED Course   This patient presents to the ED for concern of homelessness, this involves an extensive number of treatment options, and is a complaint that carries with it a high risk of complications and morbidity.  The differential diagnosis includes homelessness, malingering, infection, starvation  MDM: Emergency department for evaluation of generalized pain.  Physical exam is unremarkable.  Laboratory evaluation with a mild leukocytosis to 13.8, mild hypokalemia to 3.3 but is otherwise unremarkable.  Magnesium and potassium replaced here in the emergency department.  Patient has no complaints here in the emergency department other than concern for housing and I spoke with the social work team who states that after leaving AMA from his skilled nursing  facility they will not take him back.  The patient does not meet inpatient criteria at this time and is safe for discharge.  He was given resources for local shelters and fed here in the emergency department.  He was then discharged.   Additional history obtained: - -External records from outside source obtained and reviewed including: Chart review including previous notes, labs, imaging, consultation notes   Lab Tests: -I ordered, reviewed, and interpreted labs.   The pertinent results include:   Labs Reviewed  COMPREHENSIVE METABOLIC PANEL - Abnormal; Notable for the following components:      Result Value   Potassium 3.3 (*)    Glucose, Bld 114 (*)    BUN 31 (*)    Creatinine, Ser 1.41 (*)    GFR, Estimated 54 (*)    All other components within normal limits  CBC WITH DIFFERENTIAL/PLATELET - Abnormal; Notable for the following components:   WBC 13.8 (*)    Neutro Abs 12.1 (*)    All other components within normal limits  RESP PANEL BY RT-PCR (FLU A&B, COVID) ARPGX2  BRAIN NATRIURETIC PEPTIDE    Medicines ordered and prescription drug management: Meds ordered this encounter  Medications   ondansetron (ZOFRAN-ODT) disintegrating tablet 4 mg   potassium chloride SA (KLOR-CON M) CR tablet 40 mEq   magnesium oxide (MAG-OX) tablet 800 mg    -I have reviewed the patients home medicines and have made adjustments as needed  Critical interventions none  Social Determinants of Health:  Factors impacting patients care include: homelessness   Reevaluation: After the interventions noted above, I reevaluated the patient and found that they have :stayed the same  Co morbidities that complicate the patient evaluation  Past Medical History:  Diagnosis Date   BPH (benign prostatic hyperplasia)    Chronic pain    Cognitive communication deficit    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     Final Clinical Impression(s) / ED Diagnoses Final diagnoses:  Homelessness     @PCDICTATION @    Teressa Lower, MD 09/22/21 [phone]

## 2021-09-22 NOTE — ED Notes (Signed)
Reviewed discharge instructions with patient. Follow-up care reviewed. Patient verbalized understanding. Patient A&Ox4, VSS, and ambulatory with steady gait using rollator upon discharge.

## 2021-09-22 NOTE — ED Triage Notes (Signed)
Patient BIB [facility] from [facility]. Initally no complaints with EMS just wanted a ride to [facility] where he was d/c from several weeks ago, patient then started complaining of generalized pain which is chronic for him.

## 2021-09-22 NOTE — ED Provider Triage Note (Signed)
Emergency Medicine Provider Triage Evaluation Note  SAEL FURCHES , a 71 y.o. male  was evaluated in triage.  Pt complains of abdominal pain and leg cramping.  He states that since he checked himself out of the SNF his life has been " a giant f*ck up."  He reports that he has cramping in his abdomen and in his legs.  He is unable to tell me when it started.   Triage note reports that he was at the [facility], initially had no complaints and wanted to go back to the SNF but then began complaining of pain and requested transport to the ER.  Patient has been seen multiple times recently for malingering.   Review of Systems  Positive: See above Negative:   Physical Exam  BP (!) 160/90 (BP Location: Right Arm)    Pulse (!) 111    Temp 97.7 F (36.5 C) (Oral)    Resp 20    SpO2 95%  Gen:   Awake, no distress  Resp:  Normal effort  MSK:   Moves extremities without difficulty  Other:  Normal speech  Medical Decision Making  Medically screening exam initiated at 1:27 PM.  Appropriate orders placed.  Preciliano Castell Kabat was informed that the remainder of the evaluation will be completed by another provider, this initial triage assessment does not replace that evaluation, and the importance of remaining in the ED until their evaluation is complete.  Patient presents today for evaluation of generalized pain. He is tachycardic however, will check basic labs.     Lorin Glass, [place] 09/22/21 1330

## 2021-09-27 ENCOUNTER — Telehealth (HOSPITAL_COMMUNITY): Payer: Self-pay | Admitting: Internal Medicine

## 2021-09-27 NOTE — BH Assessment (Signed)
Care Management - [place] Follow Up Discharges   Writer attempted to make contact with patient today and was unsuccessful.  No phone number listed in epic.   Per chart review, patient was provided with outpatient resources and homeless shelter resources.

## 2021-10-01 ENCOUNTER — Other Ambulatory Visit: Payer: Self-pay

## 2021-10-01 ENCOUNTER — Emergency Department (HOSPITAL_COMMUNITY)
Admission: EM | Admit: 2021-10-01 | Discharge: 2021-10-01 | Disposition: A | Discharge status: Home / Self Care | Payer: Medicare HMO | Attending: Emergency Medicine | Admitting: Emergency Medicine

## 2021-10-01 DIAGNOSIS — G8929 Other chronic pain: Secondary | ICD-10-CM | POA: Diagnosis not present

## 2021-10-01 DIAGNOSIS — R451 Restlessness and agitation: Secondary | ICD-10-CM | POA: Insufficient documentation

## 2021-10-01 DIAGNOSIS — Z79899 Other long term (current) drug therapy: Secondary | ICD-10-CM | POA: Insufficient documentation

## 2021-10-01 DIAGNOSIS — Z5900 Homelessness unspecified: Secondary | ICD-10-CM | POA: Insufficient documentation

## 2021-10-01 DIAGNOSIS — M549 Dorsalgia, unspecified: Secondary | ICD-10-CM | POA: Diagnosis not present

## 2021-10-01 DIAGNOSIS — Z8546 Personal history of malignant neoplasm of prostate: Secondary | ICD-10-CM | POA: Diagnosis not present

## 2021-10-01 DIAGNOSIS — I10 Essential (primary) hypertension: Secondary | ICD-10-CM

## 2021-10-01 DIAGNOSIS — E876 Hypokalemia: Secondary | ICD-10-CM | POA: Insufficient documentation

## 2021-10-01 DIAGNOSIS — Z7982 Long term (current) use of aspirin: Secondary | ICD-10-CM | POA: Insufficient documentation

## 2021-10-01 DIAGNOSIS — Z20822 Contact with and (suspected) exposure to covid-19: Secondary | ICD-10-CM | POA: Insufficient documentation

## 2021-10-01 DIAGNOSIS — F439 Reaction to severe stress, unspecified: Secondary | ICD-10-CM | POA: Insufficient documentation

## 2021-10-01 LAB — COMPREHENSIVE METABOLIC PANEL
ALT: 28 U/L (ref 0–44)
AST: 31 U/L (ref 15–41)
Albumin: 2.9 g/dL — ABNORMAL LOW (ref 3.5–5.0)
Alkaline Phosphatase: 81 U/L (ref 38–126)
Anion gap: 14 (ref 5–15)
BUN: 20 mg/dL (ref 8–23)
CO2: 28 mmol/L (ref 22–32)
Calcium: 9.2 mg/dL (ref 8.9–10.3)
Chloride: 95 mmol/L — ABNORMAL LOW (ref 98–111)
Creatinine, Ser: 1.42 mg/dL — ABNORMAL HIGH (ref 0.61–1.24)
GFR, Estimated: 53 mL/min — ABNORMAL LOW (ref >60)
Glucose, Bld: 109 mg/dL — ABNORMAL HIGH (ref 70–99)
Potassium: 3.2 mmol/L — ABNORMAL LOW (ref 3.5–5.1)
Sodium: 137 mmol/L (ref 135–145)
Total Bilirubin: 0.7 mg/dL (ref 0.3–1.2)
Total Protein: 6.5 g/dL (ref 6.5–8.1)

## 2021-10-01 LAB — CBC
HCT: 41.5 % (ref 39.0–52.0)
Hemoglobin: 13.6 g/dL (ref 13.0–17.0)
MCH: 28.6 pg (ref 26.0–34.0)
MCHC: 32.8 g/dL (ref 30.0–36.0)
MCV: 87.2 fL (ref 80.0–100.0)
Platelets: 282 10*3/uL (ref 150–400)
RBC: 4.76 MIL/uL (ref 4.22–5.81)
RDW: 12.2 % (ref 11.5–15.5)
WBC: 8.6 10*3/uL (ref 4.0–10.5)
nRBC: 0 % (ref 0.0–0.2)

## 2021-10-01 LAB — RESP PANEL BY RT-PCR (FLU A&B, COVID) ARPGX2
Influenza A by PCR: NEGATIVE
Influenza B by PCR: NEGATIVE
SARS Coronavirus 2 by RT PCR: NEGATIVE

## 2021-10-01 LAB — SALICYLATE LEVEL: Salicylate Lvl: <7 mg/dL — ABNORMAL LOW (ref 7.0–30.0)

## 2021-10-01 LAB — ETHANOL: Alcohol, Ethyl (B): <10 mg/dL (ref <10)

## 2021-10-01 LAB — ACETAMINOPHEN LEVEL: Acetaminophen (Tylenol), Serum: <10 ug/mL — ABNORMAL LOW (ref 10–30)

## 2021-10-01 MED ORDER — POTASSIUM CHLORIDE CRYS ER 20 MEQ PO TBCR
40.0000 meq | EXTENDED_RELEASE_TABLET | Freq: Once | ORAL | Status: AC
  Administered 2021-10-01: 40 meq via ORAL
  Filled 2021-10-01: qty 2

## 2021-10-01 NOTE — ED Notes (Signed)
Pt belongings placed in [place] #2

## 2021-10-01 NOTE — ED Provider Notes (Signed)
Patient c/o issues with living situation, and c/o of the stressed caused by homelessness. He is not wanting to harm or kill self, rather he indicates he is smart enough to say that to get peoples attention so they will help him with his housing needs. Indicates very limited walking due to chronic back issues, but can walk several feet, states often prefers to use wheelchair for mobility.  Denies any new or acute physical symptoms or illness.   Pt w normal mood and affect. Does not appear acutely depressed or despondent. He does appear frustrated with living situation.   Will consult TOC to evaluate patient, resources and needs, and then EDP know plan.      Lajean Saver, MD 10/01/21 1447
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
## 2021-10-01 NOTE — ED Notes (Signed)
Pt taken to bathroom. Provided soap and washcloth for fecal matter.

## 2021-10-01 NOTE — ED Notes (Signed)
Pt informed he needs to remove necklace. Pt states it doesn't come off. Policy explained to pt. Pt upset, rips necklace off breaking necklace. Remains of necklace put into red bio bag and placed in belongings.

## 2021-10-01 NOTE — Discharge Instructions (Signed)
[place]   [facility] [phone] Call for shelter availability.  You must have access to a phone so they can call you back.  [facility]) [place] [phone] M-F 8:00-3:00; S-S 8:00-2:00  [place] (Men and Women) [place] [phone]  [place] (Men and Women) [place] [phone]  [facility] (Domestic Violence Shelter) [place] [phone]  [facility] (Men) [place] [phone]  [facility] (Single women and women with children) [address] [place] [phone]     [facility] [address] Barbette Reichmann [place], [place] [address]  [phone]

## 2021-10-01 NOTE — ED Provider Triage Note (Signed)
Emergency Medicine Provider Triage Evaluation Note  KIPP SHANK , a 71 y.o. male  was evaluated in triage.  Pt complains of suicidal ideation. 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 He reports he is homeless and has repeatedly tried to roll his wheelchair in front of traffic. He also reports multiple other problems such as difficulty finding clothing, chronic pain, leg swelling, etc.   Review of Systems  Positive: Suicidal ideation Negative: Homicidal ideation, visual or auditory hallucinations  Physical Exam  BP (!) 175/83 (BP Location: Left Arm)    Pulse (!) 122    Temp 98.4 F (36.9 C) (Oral)    Resp 20    SpO2 100%  Gen:   Awake, no distress   Resp:  Normal effort  MSK:   Moves extremities without difficulty  Other:    Medical Decision Making  Medically screening exam initiated at 12:17 PM.  Appropriate orders placed.  Zameer Borman Coppess was informed that the remainder of the evaluation will be completed by another provider, this initial triage assessment does not replace that evaluation, and the importance of remaining in the ED until their evaluation is complete.  Will obtain medical screening labs and consult TTS   Drayke Grabel T, PA-C 10/01/21 1224

## 2021-10-01 NOTE — ED Provider Notes (Signed)
[facility] EMERGENCY DEPARTMENT Provider Note   CSN: [id number] Arrival date & time: 10/01/21  1156     History  Chief Complaint  Patient presents with   Psychiatric Evaluation    Jeffrey Key is a 71 y.o. male.  Patient with history of recent homelessness after leaving [facility] and prostate cancer presents today complaining of stress caused by homelessness. He states that since he left the nursing facility he has been living on the streets with frequent visits to ED and behavioral health to get help with housing. He states that no one will listen to him and therefore he presents today to again request assistance with finding housing. He denies suicidal ideation and rather states that he originally presented here stating he was suicidal only to get attention and assistance faster. He is mostly wheelchair bound due to chronic back pain, but does state that he can walk if necessary. He denies any acute symptoms at this time.  The history is provided by the patient. No language interpreter was used.      Home Medications Prior to Admission medications   Medication Sig Start Date End Date Taking? Authorizing Provider  acetaminophen (TYLENOL) 325 MG tablet Take 2 tablets (650 mg total) by mouth every 6 (six) hours as needed for mild pain. 09/18/21 12/17/21  [name], MD  aspirin 81 MG chewable tablet Chew 81 mg by mouth daily.    [provider]  buprenorphine (BUTRANS) 5 MCG/HR [place] onto the skin once a week.    [provider]  CHLORASEPTIC 1.4 % LIQD Use as directed 1 spray in the mouth or throat every 6 (six) hours as needed for throat irritation / pain. 01/18/21   [provider]  cholecalciferol (VITAMIN D3) 25 MCG (1000 UNIT) tablet Take 2,000 Units by mouth daily.    [provider]  diclofenac Sodium (VOLTAREN) 1 % GEL Apply 2 g topically every 8 (eight) hours as needed (pain in left elbow).    [provider]  finasteride (PROSCAR) 5 MG tablet Take 5 mg by mouth daily.    [provider]  gabapentin (NEURONTIN) 400 MG capsule Take 1 capsule (400 mg total) by mouth 3 (three) times daily. 09/18/21   [name], MD  guaiFENesin (ROBITUSSIN) 100 MG/5ML liquid Take 200 mg by mouth every 6 (six) hours as needed for cough or to loosen phlegm.    [provider]  hydroxychloroquine (PLAQUENIL) 200 MG tablet Take 1 tablet (200 mg total) by mouth 2 (two) times daily. 09/18/21 12/17/21  [name], MD  ketoconazole (NIZORAL) 2 % cream Apply 1 application topically daily. Patient taking differently: Apply 1 application topically daily. To the bottom of both feet 06/12/21   [name], MD  losartan-hydrochlorothiazide (HYZAAR) 50-12.5 MG tablet Take 1 tablet by mouth daily. 09/18/21 09/18/22  [name], MD  methocarbamol (ROBAXIN) 500 MG tablet Take 1 tablet (500 mg total) by mouth every 8 (eight) hours as needed for muscle spasms. 01/07/21   [name], MD  metoprolol succinate (TOPROL-XL) 50 MG 24 hr tablet Take 1 tablet (50 mg total) by mouth daily. Take with or immediately following a meal. 09/18/21 12/17/21  [name], MD  nortriptyline (PAMELOR) 50 MG capsule Take 2 capsules (100 mg total) by mouth at bedtime. 09/18/21 12/17/21  [name], MD  [facility] powder Apply 1 application topically 2 (two) times daily as needed for rash. Groin folds 07/31/21   [provider]  omeprazole (PRILOSEC) 20 MG capsule Take 1 capsule (20 mg total) by mouth daily. 09/18/21 12/17/21  [name], MD  polyethylene glycol (MIRALAX / GLYCOLAX) 17 g packet Take 17 g by mouth daily.    [provider]  SYSTANE COMPLETE 0.6 % SOLN Place 1 drop into both eyes in the morning and at bedtime. 08/28/21   [provider]  tamsulosin (FLOMAX) 0.4 MG CAPS capsule Take 0.4 mg by mouth daily after supper.    [provider]  triamcinolone cream  (KENALOG) 0.5 % Apply 1 application topically 3 (three) times daily as needed (itching).    [provider]      Allergies    Quetiapine, Tramadol, Trazodone and nefazodone, and Lisinopril    Review of Systems   Review of Systems  Constitutional:  Negative for chills and fever.  Skin:  Negative for rash and wound.  Psychiatric/Behavioral:  Positive for agitation. Negative for confusion, decreased concentration, dysphoric mood, hallucinations, self-injury and suicidal ideas. The patient is not nervous/anxious.   All other systems reviewed and are negative.  Physical Exam Updated Vital Signs BP (!) 175/83 (BP Location: Left Arm)    Pulse (!) 122    Temp 98.4 F (36.9 C) (Oral)    Resp 20    SpO2 100%  Physical Exam Vitals and nursing note reviewed.  Constitutional:      General: He is not in acute distress.    Appearance: Normal appearance. He is normal weight. He is not ill-appearing, toxic-appearing or diaphoretic.     Comments: Patient sitting comfortably in wheelchair in no acute distress  HENT:     Head: Normocephalic and atraumatic.  Eyes:     Extraocular Movements: Extraocular movements intact.     Pupils: Pupils are equal, round, and reactive to light.  Cardiovascular:     Rate and Rhythm: Normal rate.  Pulmonary:     Effort: Pulmonary effort is normal.  Abdominal:     General: Abdomen is flat.     Palpations: Abdomen is soft.  Musculoskeletal:        General: Normal range of motion.     Cervical back: Normal range of motion.  Skin:    General: Skin is warm and dry.  Neurological:     General: No focal deficit present.     Mental Status: He is alert.  Psychiatric:        Attention and Perception: Attention and perception normal. He does not perceive auditory or visual hallucinations.        Mood and Affect: Mood normal.        Speech: Speech normal.        Behavior: Behavior is agitated.        Thought Content: Thought content normal.        Cognition  and Memory: Cognition normal.        Judgment: Judgment normal.    ED Results / Procedures / Treatments   Labs (all labs ordered are listed, but only abnormal results are displayed) Labs Reviewed  COMPREHENSIVE METABOLIC PANEL - Abnormal; Notable for the following components:      Result Value   Potassium 3.2 (*)    Chloride 95 (*)    Glucose, Bld 109 (*)    Creatinine, Ser 1.42 (*)    Albumin 2.9 (*)    GFR, Estimated 53 (*)    All other components within normal limits  SALICYLATE LEVEL - Abnormal; Notable for the following components:   Salicylate  Lvl <7.0 (*)    All other components within normal limits  ACETAMINOPHEN LEVEL - Abnormal; Notable for the following components:   Acetaminophen (Tylenol), Serum <10 (*)    All other components within normal limits  RESP PANEL BY RT-PCR (FLU A&B, COVID) ARPGX2  ETHANOL  CBC  RAPID URINE DRUG SCREEN, HOSP PERFORMED    EKG None  Radiology No results found.  Procedures Procedures    Medications Ordered in ED Medications - No data to display  ED Course/ Medical Decision Making/ A&P                           Medical Decision Making Amount and/or Complexity of Data Reviewed Labs: ordered.   Patient presents today seeking housing assistance. He initially stated that he was suicidal but now denies any SI. States that he only said those things thinking it would help him better receive housing assistance. He has presented to the ER numerous times recently with similar complaints.  I, Lavonna Rua, PA-C, personally reviewed and evaluated these lab results supported by medical decision making  Patient with mild hypokalemia at 3.2. No other acute lab findings. Will replete potassium and consult TOC to assist with finding housing.  Social work formally evaluated the patient and spent several hours working to find him housing options and was unable to find placement for the patient today. They have supplied him with resources that  he can follow up with for further assistance. Patient has no medical complaints at this time upon reassessment. He is afebrile, non-toxic appearing, and in no acute distress with reassuring vital signs. He is stable for discharge at this time. Educated on red flag symptoms that would prompt immediate return. Discharged in stable condition.  This is a shared visit with supervising physician Dr. Karle Starch who has independently evaluated patient & provided guidance in evaluation/management/disposition, in agreement with care    Final Clinical Impression(s) / ED Diagnoses Final diagnoses:  Homelessness  Hypokalemia  Essential hypertension    Rx / DC Orders ED Discharge Orders     None     An After Visit Summary was printed and given to the patient.     Nestor Lewandowsky 10/02/21 2334    [name], MD 10/03/21 [phone]

## 2021-10-01 NOTE — ED Triage Notes (Signed)
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 Has multiple other problems including inability to find clothing, chronic pain, leg swelling, etc.

## 2022-01-01 DIAGNOSIS — K5909 Other constipation: Secondary | ICD-10-CM | POA: Diagnosis not present

## 2022-01-01 DIAGNOSIS — N400 Enlarged prostate without lower urinary tract symptoms: Secondary | ICD-10-CM | POA: Diagnosis not present

## 2022-01-01 DIAGNOSIS — M069 Rheumatoid arthritis, unspecified: Secondary | ICD-10-CM | POA: Diagnosis not present

## 2022-01-01 DIAGNOSIS — G9009 Other idiopathic peripheral autonomic neuropathy: Secondary | ICD-10-CM | POA: Diagnosis not present

## 2022-01-01 DIAGNOSIS — R609 Edema, unspecified: Secondary | ICD-10-CM | POA: Diagnosis not present

## 2022-01-01 DIAGNOSIS — K219 Gastro-esophageal reflux disease without esophagitis: Secondary | ICD-10-CM | POA: Diagnosis not present

## 2022-01-04 DIAGNOSIS — I10 Essential (primary) hypertension: Secondary | ICD-10-CM | POA: Diagnosis not present

## 2022-01-04 DIAGNOSIS — E538 Deficiency of other specified B group vitamins: Secondary | ICD-10-CM | POA: Diagnosis not present

## 2022-01-07 DIAGNOSIS — Z20822 Contact with and (suspected) exposure to covid-19: Secondary | ICD-10-CM | POA: Diagnosis not present

## 2022-01-13 DIAGNOSIS — F339 Major depressive disorder, recurrent, unspecified: Secondary | ICD-10-CM | POA: Diagnosis not present

## 2022-01-13 DIAGNOSIS — G894 Chronic pain syndrome: Secondary | ICD-10-CM | POA: Diagnosis not present

## 2022-01-15 DIAGNOSIS — G894 Chronic pain syndrome: Secondary | ICD-10-CM | POA: Diagnosis not present

## 2022-01-15 DIAGNOSIS — G629 Polyneuropathy, unspecified: Secondary | ICD-10-CM | POA: Diagnosis not present

## 2022-01-28 DIAGNOSIS — Z20822 Contact with and (suspected) exposure to covid-19: Secondary | ICD-10-CM | POA: Diagnosis not present

## 2022-02-11 DIAGNOSIS — Z1159 Encounter for screening for other viral diseases: Secondary | ICD-10-CM | POA: Diagnosis not present

## 2022-02-18 DIAGNOSIS — Z20822 Contact with and (suspected) exposure to covid-19: Secondary | ICD-10-CM | POA: Diagnosis not present

## 2022-02-24 DIAGNOSIS — Z960 Presence of urogenital implants: Secondary | ICD-10-CM | POA: Diagnosis not present

## 2022-02-24 DIAGNOSIS — R109 Unspecified abdominal pain: Secondary | ICD-10-CM | POA: Diagnosis not present

## 2022-02-24 DIAGNOSIS — I959 Hypotension, unspecified: Secondary | ICD-10-CM | POA: Diagnosis not present

## 2022-02-24 DIAGNOSIS — I10 Essential (primary) hypertension: Secondary | ICD-10-CM | POA: Diagnosis not present

## 2022-02-24 DIAGNOSIS — T83010A Breakdown (mechanical) of cystostomy catheter, initial encounter: Secondary | ICD-10-CM | POA: Diagnosis not present

## 2022-02-24 DIAGNOSIS — Z743 Need for continuous supervision: Secondary | ICD-10-CM | POA: Diagnosis not present

## 2022-02-24 DIAGNOSIS — R1030 Lower abdominal pain, unspecified: Secondary | ICD-10-CM | POA: Diagnosis not present

## 2022-02-24 DIAGNOSIS — Z9049 Acquired absence of other specified parts of digestive tract: Secondary | ICD-10-CM | POA: Diagnosis not present

## 2022-02-24 DIAGNOSIS — R339 Retention of urine, unspecified: Secondary | ICD-10-CM | POA: Diagnosis not present

## 2022-02-25 DIAGNOSIS — G894 Chronic pain syndrome: Secondary | ICD-10-CM | POA: Diagnosis not present

## 2022-02-25 DIAGNOSIS — G629 Polyneuropathy, unspecified: Secondary | ICD-10-CM | POA: Diagnosis not present

## 2022-03-10 IMAGING — CR DG CHEST 2V
3 series · 3 of 3 positions shown · non-contrast
Comparison: None.

CLINICAL DATA: Shortness of breath

EXAM:
CHEST - 2 VIEW

[w chest lat (1 of 2)]
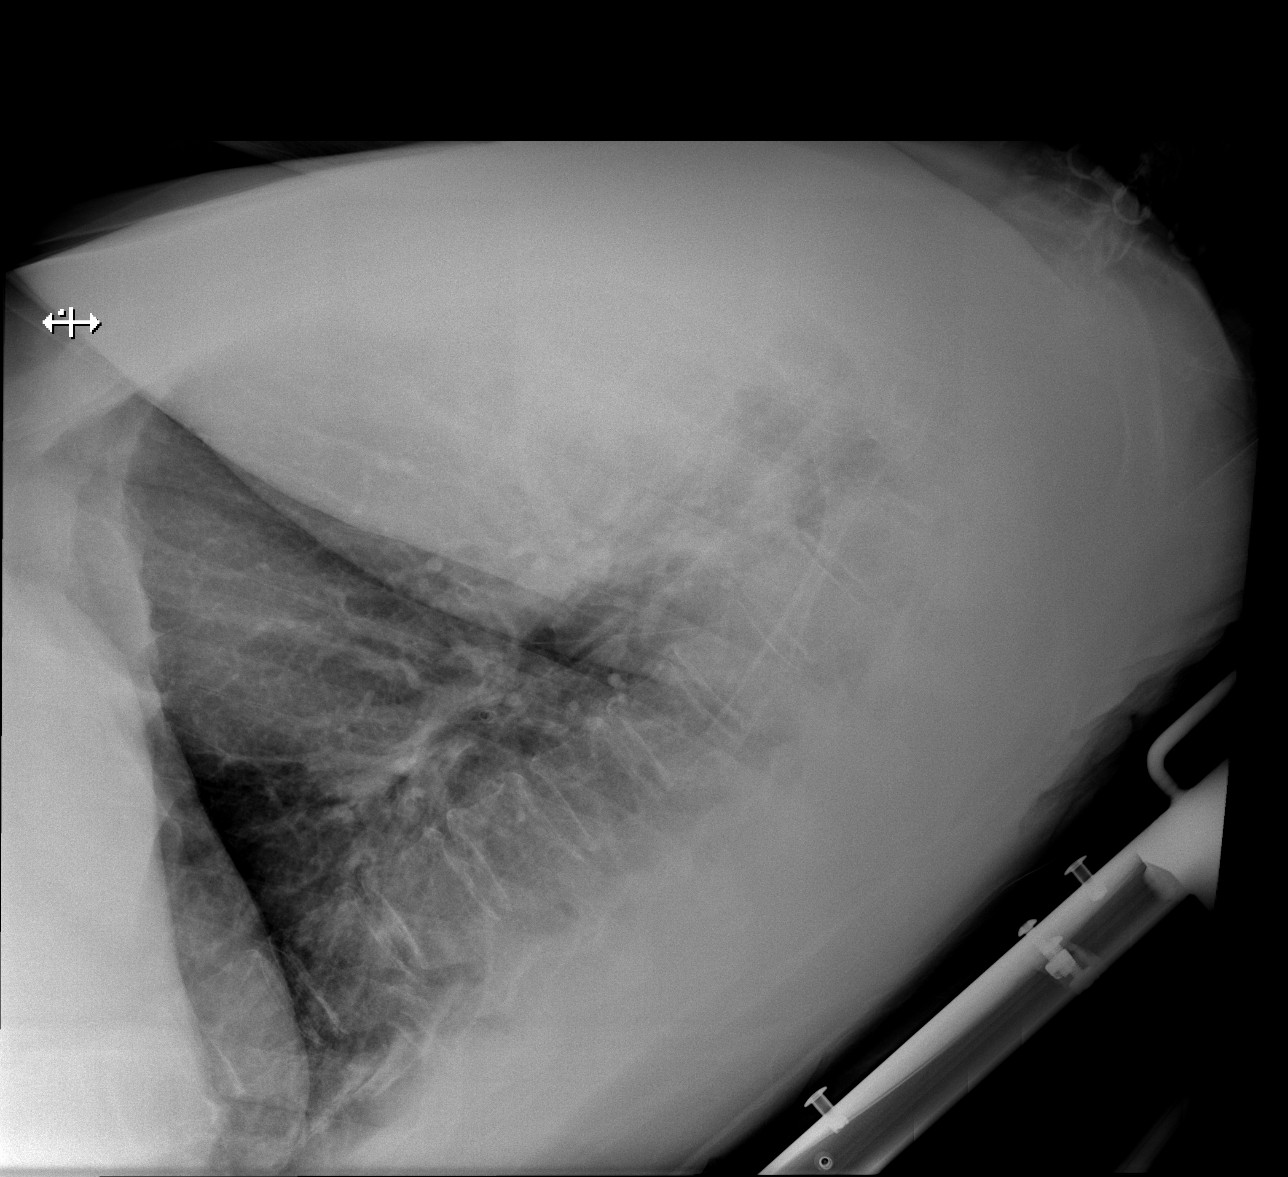

[w chest lat (2 of 2)]
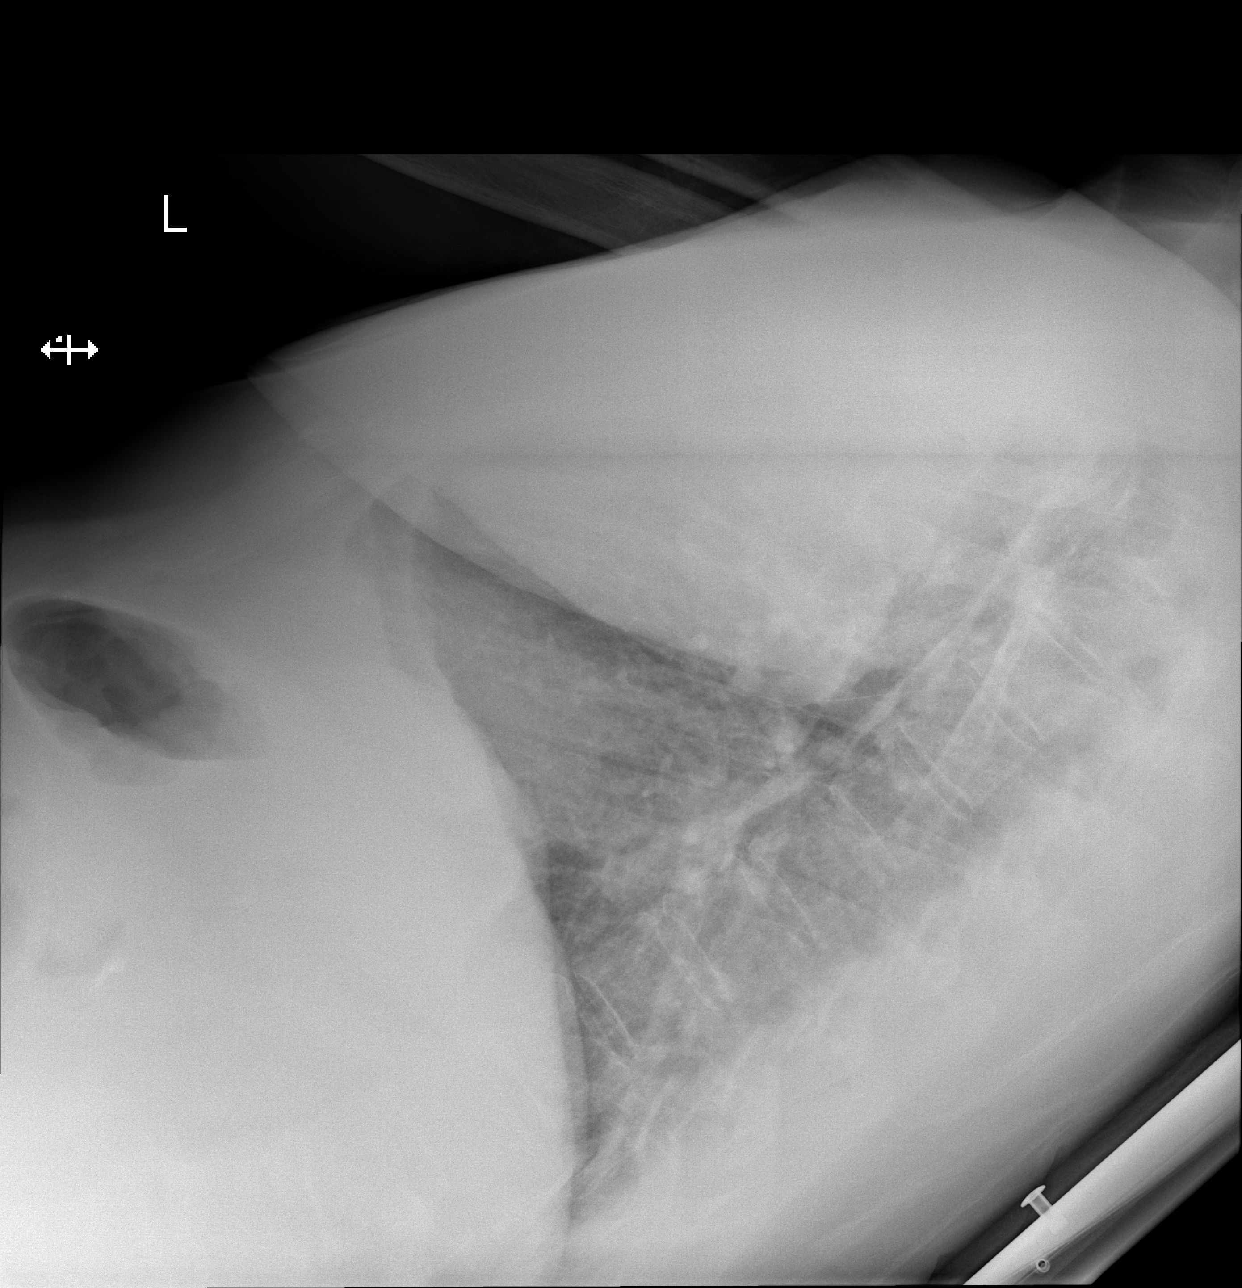

[x chest ap]
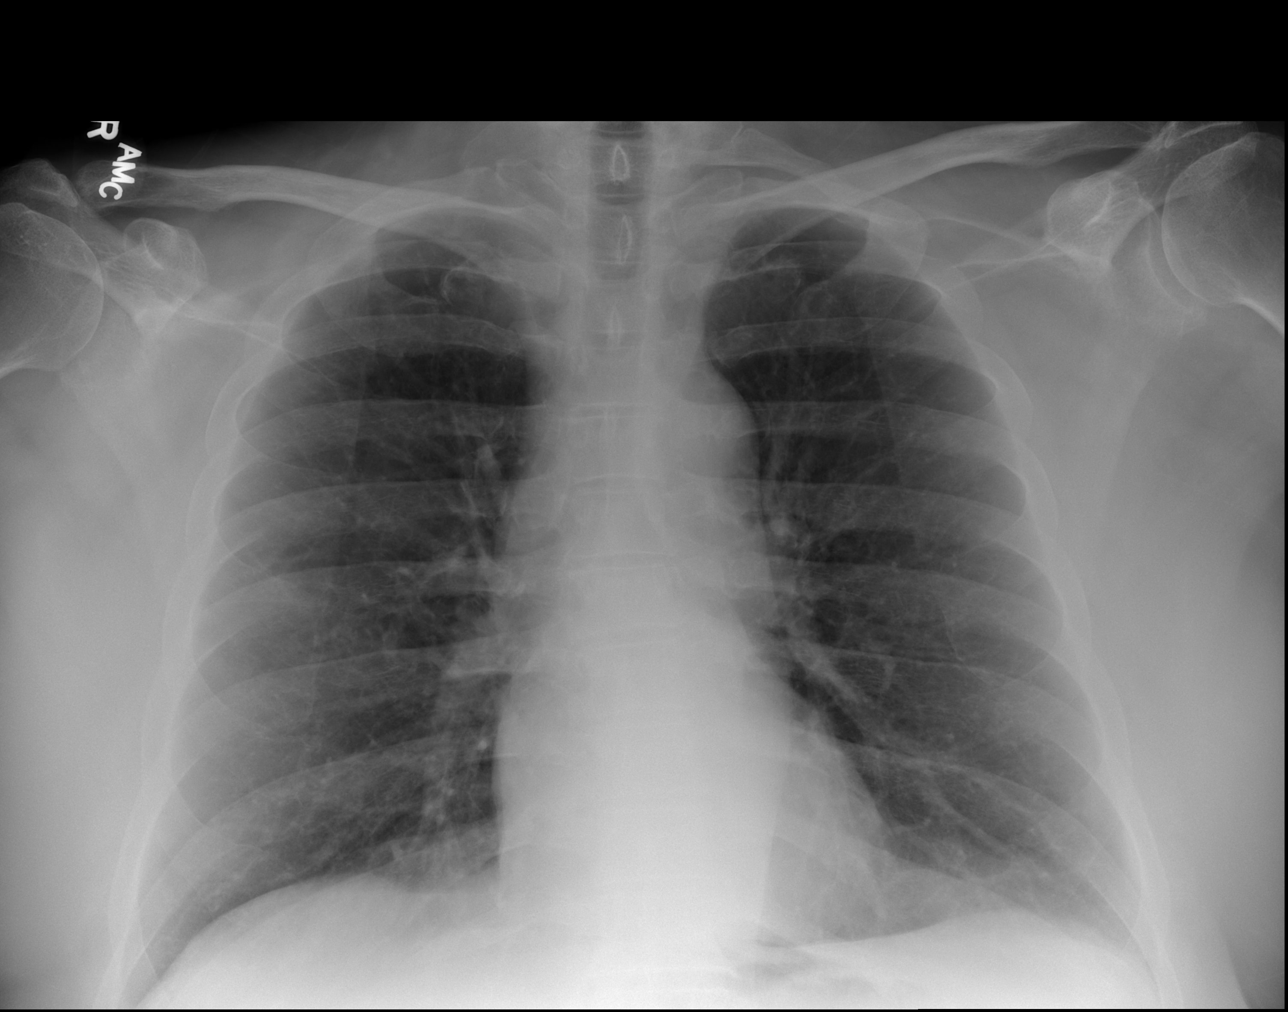

[3 of 3 positions shown; findings below may reference images not displayed]

FINDINGS: Cardiac silhouette and mediastinal contours are within normal
limits. The lungs are clear. No pleural effusion or pneumothorax.
Mild multilevel degenerative disc changes of the thoracic spine.
IMPRESSION: No active cardiopulmonary disease.
